# Patient Record
Sex: Female | Born: 1950
Health system: Southern US, Community
[De-identification: ages and names within clinical notes are randomized; demographics above are authoritative.]

## PROBLEM LIST (undated history)

## (undated) DIAGNOSIS — T7840XA Allergy, unspecified, initial encounter: Secondary | ICD-10-CM

## (undated) DIAGNOSIS — L719 Rosacea, unspecified: Secondary | ICD-10-CM

## (undated) DIAGNOSIS — J302 Other seasonal allergic rhinitis: Secondary | ICD-10-CM

## (undated) DIAGNOSIS — K5790 Diverticulosis of intestine, part unspecified, without perforation or abscess without bleeding: Secondary | ICD-10-CM

## (undated) DIAGNOSIS — M8580 Other specified disorders of bone density and structure, unspecified site: Secondary | ICD-10-CM

## (undated) DIAGNOSIS — M2550 Pain in unspecified joint: Secondary | ICD-10-CM

## (undated) DIAGNOSIS — R350 Frequency of micturition: Secondary | ICD-10-CM

## (undated) DIAGNOSIS — C50919 Malignant neoplasm of unspecified site of unspecified female breast: Secondary | ICD-10-CM

## (undated) DIAGNOSIS — R112 Nausea with vomiting, unspecified: Secondary | ICD-10-CM

## (undated) DIAGNOSIS — L309 Dermatitis, unspecified: Secondary | ICD-10-CM

## (undated) DIAGNOSIS — Z9889 Other specified postprocedural states: Secondary | ICD-10-CM

## (undated) DIAGNOSIS — Z8619 Personal history of other infectious and parasitic diseases: Secondary | ICD-10-CM

## (undated) DIAGNOSIS — D0510 Intraductal carcinoma in situ of unspecified breast: Secondary | ICD-10-CM

## (undated) DIAGNOSIS — K648 Other hemorrhoids: Secondary | ICD-10-CM

## (undated) DIAGNOSIS — C801 Malignant (primary) neoplasm, unspecified: Secondary | ICD-10-CM

## (undated) DIAGNOSIS — R351 Nocturia: Secondary | ICD-10-CM

## (undated) HISTORY — PX: BASAL CELL CARCINOMA EXCISION: SHX1214

## (undated) HISTORY — DX: Allergy, unspecified, initial encounter: T78.40XA

## (undated) HISTORY — DX: Other specified disorders of bone density and structure, unspecified site: M85.80

## (undated) HISTORY — PX: COLONOSCOPY: SHX174

## (undated) HISTORY — PX: TONSILLECTOMY: SUR1361

## (undated) HISTORY — DX: Malignant (primary) neoplasm, unspecified: C80.1

## (undated) HISTORY — PX: OTHER SURGICAL HISTORY: SHX169

---

## 2000-02-13 ENCOUNTER — Encounter: Payer: Self-pay | Admitting: Internal Medicine

## 2000-02-13 ENCOUNTER — Encounter: Admission: RE | Admit: 2000-02-13 | Discharge: 2000-02-13 | Payer: Self-pay | Admitting: Internal Medicine

## 2001-03-13 ENCOUNTER — Encounter: Payer: Self-pay | Admitting: Internal Medicine

## 2001-03-13 ENCOUNTER — Encounter: Admission: RE | Admit: 2001-03-13 | Discharge: 2001-03-13 | Payer: Self-pay | Admitting: Internal Medicine

## 2002-06-18 ENCOUNTER — Encounter: Payer: Self-pay | Admitting: Internal Medicine

## 2002-06-18 ENCOUNTER — Encounter: Admission: RE | Admit: 2002-06-18 | Discharge: 2002-06-18 | Payer: Self-pay | Admitting: Internal Medicine

## 2003-08-12 ENCOUNTER — Encounter: Payer: Self-pay | Admitting: Internal Medicine

## 2003-08-12 ENCOUNTER — Encounter: Admission: RE | Admit: 2003-08-12 | Discharge: 2003-08-12 | Payer: Self-pay | Admitting: Internal Medicine

## 2004-03-04 ENCOUNTER — Emergency Department (HOSPITAL_COMMUNITY): Admission: EM | Admit: 2004-03-04 | Discharge: 2004-03-04 | Payer: Self-pay | Admitting: *Deleted

## 2004-08-24 ENCOUNTER — Encounter: Admission: RE | Admit: 2004-08-24 | Discharge: 2004-08-24 | Payer: Self-pay | Admitting: Internal Medicine

## 2004-09-04 ENCOUNTER — Encounter: Admission: RE | Admit: 2004-09-04 | Discharge: 2004-09-04 | Payer: Self-pay | Admitting: Internal Medicine

## 2005-09-20 ENCOUNTER — Encounter: Admission: RE | Admit: 2005-09-20 | Discharge: 2005-09-20 | Payer: Self-pay | Admitting: Internal Medicine

## 2005-11-21 ENCOUNTER — Encounter: Admission: RE | Admit: 2005-11-21 | Discharge: 2005-11-21 | Payer: Self-pay | Admitting: *Deleted

## 2006-03-25 ENCOUNTER — Ambulatory Visit: Payer: Self-pay | Admitting: Internal Medicine

## 2006-05-06 ENCOUNTER — Ambulatory Visit: Payer: Self-pay | Admitting: Internal Medicine

## 2006-05-07 ENCOUNTER — Ambulatory Visit: Payer: Self-pay | Admitting: Internal Medicine

## 2006-10-10 ENCOUNTER — Encounter: Admission: RE | Admit: 2006-10-10 | Discharge: 2006-10-10 | Payer: Self-pay | Admitting: Internal Medicine

## 2007-01-01 ENCOUNTER — Encounter (INDEPENDENT_AMBULATORY_CARE_PROVIDER_SITE_OTHER): Payer: Self-pay | Admitting: Specialist

## 2007-01-01 ENCOUNTER — Ambulatory Visit (HOSPITAL_COMMUNITY): Admission: RE | Admit: 2007-01-01 | Discharge: 2007-01-01 | Payer: Self-pay | Admitting: Obstetrics & Gynecology

## 2007-09-22 ENCOUNTER — Encounter: Payer: Self-pay | Admitting: *Deleted

## 2007-09-22 DIAGNOSIS — F17200 Nicotine dependence, unspecified, uncomplicated: Secondary | ICD-10-CM

## 2007-09-22 DIAGNOSIS — Z9089 Acquired absence of other organs: Secondary | ICD-10-CM | POA: Insufficient documentation

## 2007-09-22 DIAGNOSIS — H6690 Otitis media, unspecified, unspecified ear: Secondary | ICD-10-CM | POA: Insufficient documentation

## 2007-10-23 ENCOUNTER — Encounter: Admission: RE | Admit: 2007-10-23 | Discharge: 2007-10-23 | Payer: Self-pay | Admitting: Internal Medicine

## 2007-10-28 ENCOUNTER — Encounter: Admission: RE | Admit: 2007-10-28 | Discharge: 2007-10-28 | Payer: Self-pay | Admitting: Internal Medicine

## 2008-04-15 ENCOUNTER — Encounter: Admission: RE | Admit: 2008-04-15 | Discharge: 2008-04-15 | Payer: Self-pay | Admitting: Internal Medicine

## 2008-10-24 ENCOUNTER — Encounter: Admission: RE | Admit: 2008-10-24 | Discharge: 2008-10-24 | Payer: Self-pay | Admitting: Internal Medicine

## 2009-10-25 ENCOUNTER — Encounter: Admission: RE | Admit: 2009-10-25 | Discharge: 2009-10-25 | Payer: Self-pay | Admitting: Internal Medicine

## 2010-10-26 ENCOUNTER — Encounter
Admission: RE | Admit: 2010-10-26 | Discharge: 2010-10-26 | Payer: Self-pay | Source: Home / Self Care | Attending: Internal Medicine | Admitting: Internal Medicine

## 2010-12-01 ENCOUNTER — Encounter: Payer: Self-pay | Admitting: Internal Medicine

## 2010-12-02 ENCOUNTER — Encounter: Payer: Self-pay | Admitting: Internal Medicine

## 2011-03-29 NOTE — Op Note (Signed)
NAMEFRANNIE, [name]             ACCOUNT NO.:  [id number]   MEDICAL RECORD NO.:  [id number]          PATIENT TYPE:  AMB   LOCATION:  SDC                           FACILITY:  [facility]   PHYSICIAN:  Genia Del, M.D.DATE OF BIRTH:  October 15, 1951   DATE OF PROCEDURE:  01/01/2007  DATE OF DISCHARGE:                               OPERATIVE REPORT   PREOPERATIVE DIAGNOSIS:  Intrauterine lesion, probable endometrial  polyp.   POSTOPERATIVE DIAGNOSIS:  Intrauterine lesion, probable endometrial  polyp.   INTERVENTION:  Hysteroscopy with resection and dilatation and curettage.   SURGEON:  Dr. Genia Del.   ASSISTANT:  None.   ANESTHESIOLOGIST:  Burnett Corrente, M.D.   PROCEDURE:  Under MAC analgesia, the patient is in lithotomy position.  She is prepped with Betadine on the suprapubic, vulvar and vaginal  areas.  The bladder is catheterized and the patient is draped as usual.  The vaginal exam reveals an anteverted uterus, slightly increased in  volume secondary to fibroids, about 9 cm, mobile, no adnexal mass.  The  speculum was introduced in the vagina.  The anterior lip of the cervix  is grasped with a tenaculum.  A paracervical block is done with  Nesacaine 1% a total of 20 mL at 4 and 8 o'clock.  We do a hysterometry  which is 9 cm.  We then dilate the cervix at Hegar dilators #27, without  difficulty.  We introduce the small resectoscope in the intrauterine  cavity.  We visualize the entire cavity.  An anterior lesion is present  toward the right side measuring about 2 cm in diameter compatible with  an endometrial polyp.  The rest of the cavity is normal except for  probable anterior intramural myoma slightly bulging on the anterior  wall.  We proceed with resection of the intrauterine lesion.  The  specimen is sent to pathology.  Hemostasis is completed with the cautery  current and pictures are taken before and after resection.  We then  proceed with a systematic  curettage of the entire uterine cavity on all  surfaces with a sharp curette.  The specimen is sent separately to  pathology.  We then remove the tenaculum on the anterior lip of the  cervix.  Hemostasis is adequate at all levels.  We remove the speculum.  The estimated blood loss was minimal.  The fluid deficit was 250 mL.  No  complications occurred and the patient was brought to recovery room in  good stable status.      Genia Del, M.D.  Electronically Signed     ML/MEDQ  D:  01/01/2007  T:  01/01/2007  Job:  [id number]

## 2011-10-01 ENCOUNTER — Other Ambulatory Visit: Payer: Self-pay | Admitting: Internal Medicine

## 2011-10-01 DIAGNOSIS — Z1231 Encounter for screening mammogram for malignant neoplasm of breast: Secondary | ICD-10-CM

## 2011-10-29 ENCOUNTER — Ambulatory Visit
Admission: RE | Admit: 2011-10-29 | Discharge: 2011-10-29 | Disposition: A | Discharge status: Home / Self Care | Payer: BC Managed Care – PPO | Source: Ambulatory Visit | Attending: Internal Medicine | Admitting: Internal Medicine

## 2011-10-29 DIAGNOSIS — Z1231 Encounter for screening mammogram for malignant neoplasm of breast: Secondary | ICD-10-CM

## 2012-03-16 ENCOUNTER — Encounter: Payer: Self-pay | Admitting: Internal Medicine

## 2012-03-17 ENCOUNTER — Encounter: Payer: Self-pay | Admitting: Internal Medicine

## 2012-03-20 ENCOUNTER — Other Ambulatory Visit (HOSPITAL_COMMUNITY): Payer: Self-pay | Admitting: Radiology

## 2012-03-20 DIAGNOSIS — J984 Other disorders of lung: Secondary | ICD-10-CM

## 2012-03-23 ENCOUNTER — Encounter: Payer: Self-pay | Admitting: Internal Medicine

## 2012-03-23 ENCOUNTER — Ambulatory Visit (AMBULATORY_SURGERY_CENTER): Payer: BC Managed Care – PPO | Admitting: *Deleted

## 2012-03-23 VITALS — Ht 66.0 in | Wt 135.6 lb

## 2012-03-23 DIAGNOSIS — Z1211 Encounter for screening for malignant neoplasm of colon: Secondary | ICD-10-CM

## 2012-03-23 MED ORDER — PEG-KCL-NACL-NASULF-NA ASC-C 100 G PO SOLR: ORAL | Status: DC

## 2012-03-26 ENCOUNTER — Ambulatory Visit (HOSPITAL_COMMUNITY)
Admission: RE | Admit: 2012-03-26 | Discharge: 2012-03-26 | Disposition: A | Discharge status: Home / Self Care | Payer: BC Managed Care – PPO | Source: Ambulatory Visit | Attending: Internal Medicine | Admitting: Internal Medicine

## 2012-03-26 DIAGNOSIS — R0989 Other specified symptoms and signs involving the circulatory and respiratory systems: Secondary | ICD-10-CM | POA: Insufficient documentation

## 2012-03-26 DIAGNOSIS — R0609 Other forms of dyspnea: Secondary | ICD-10-CM | POA: Insufficient documentation

## 2012-03-26 MED ORDER — ALBUTEROL SULFATE (5 MG/ML) 0.5% IN NEBU
2.5000 mg | INHALATION_SOLUTION | Freq: Once | RESPIRATORY_TRACT | Status: AC
  Administered 2012-03-26: 2.5 mg via RESPIRATORY_TRACT

## 2012-03-30 ENCOUNTER — Ambulatory Visit (AMBULATORY_SURGERY_CENTER): Payer: BC Managed Care – PPO | Admitting: Internal Medicine

## 2012-03-30 ENCOUNTER — Encounter: Payer: Self-pay | Admitting: Internal Medicine

## 2012-03-30 VITALS — BP 126/78 | HR 72 | Temp 97.9°F | Resp 13 | Ht 66.0 in | Wt 135.0 lb

## 2012-03-30 DIAGNOSIS — Z1211 Encounter for screening for malignant neoplasm of colon: Secondary | ICD-10-CM

## 2012-03-30 DIAGNOSIS — K5730 Diverticulosis of large intestine without perforation or abscess without bleeding: Secondary | ICD-10-CM

## 2012-03-30 MED ORDER — SODIUM CHLORIDE 0.9 % IV SOLN: 500.0000 mL | INTRAVENOUS | Status: DC

## 2012-03-30 NOTE — Op Note (Signed)
[facility] [address] [place], [place]  [address]  COLONOSCOPY PROCEDURE REPORT  PATIENT:  Catherine, [name]  MR#:  [id number] BIRTHDATE:  1950/12/14, 60 yrs. old  GENDER:  female ENDOSCOPIST:  Wilhemina Bonito. Eda Keys, MD REF. BY:  Rodrigo Ran, M.D. PROCEDURE DATE:  03/30/2012 PROCEDURE:  Average-risk screening colonoscopy G0121 ASA CLASS:  Class II INDICATIONS:  Routine Risk Screening, heme positive stool MEDICATIONS:   MAC sedation, administered by CRNA, propofol (Diprivan) 350 mg IV  DESCRIPTION OF PROCEDURE:   After the risks benefits and alternatives of the procedure were thoroughly explained, informed consent was obtained.  Digital rectal exam was performed and revealed no abnormalities.   The LB CF-[id number] endoscope was introduced through the anus and advanced to the cecum, which was identified by both the appendix and ileocecal valve, without limitations.  The quality of the prep was excellent, using MoviPrep.  The instrument was then slowly withdrawn as the colon was fully examined. <<PROCEDUREIMAGES>>  FINDINGS:  Moderate diverticulosis was found in the sigmoid colon. Otherwise normal colonoscopy without  polyps, masses, vascular ectasias, or inflammatory changes.   Retroflexed views in the rectum revealed internal hemorrhoids.    The time to cecum =  6:36 minutes. The scope was then withdrawn in 10:37  minutes from the cecum and the procedure completed.  COMPLICATIONS:  None  ENDOSCOPIC IMPRESSION: 1) Moderate diverticulosis in the sigmoid colon 2) Otherwise normal colonoscopy 3) Internal hemorrhoids  RECOMMENDATIONS: 1) Continue current colorectal screening recommendations for "routine risk" patients with a repeat colonoscopy in 10 years.  ______________________________ Wilhemina Bonito. Eda Keys, MD  CC:  Rodrigo Ran, MD; The Patient  n. eSIGNED:   Wilhemina Bonito. Eda Keys at 03/30/2012 12:52 PM  Daphene Jaeger, [phone]

## 2012-03-30 NOTE — Progress Notes (Signed)
Patient did not have preoperative order for IV antibiotic SSI prophylaxis. (G8918)  Patient did not experience any of the following events: a burn prior to discharge; a fall within the facility; wrong site/side/patient/procedure/implant event; or a hospital transfer or hospital admission upon discharge from the facility. (G8907)  

## 2012-03-30 NOTE — Patient Instructions (Signed)
YOU HAD AN ENDOSCOPIC PROCEDURE TODAY AT THE [facility]: Refer to the procedure report that was given to you for any specific questions about what was found during the examination.  If the procedure report does not answer your questions, please call your gastroenterologist to clarify.  If you requested that your care partner not be given the details of your procedure findings, then the procedure report has been included in a sealed envelope for you to review at your convenience later.  YOU SHOULD EXPECT: Some feelings of bloating in the abdomen. Passage of more gas than usual.  Walking can help get rid of the air that was put into your GI tract during the procedure and reduce the bloating. If you had a lower endoscopy (such as a colonoscopy or flexible sigmoidoscopy) you may notice spotting of blood in your stool or on the toilet paper. If you underwent a bowel prep for your procedure, then you may not have a normal bowel movement for a few days.  DIET: Your first meal following the procedure should be a light meal and then it is ok to progress to your normal diet.  A half-sandwich or bowl of soup is an example of a good first meal.  Heavy or fried foods are harder to digest and may make you feel nauseous or bloated.  Likewise meals heavy in dairy and vegetables can cause extra gas to form and this can also increase the bloating.  Drink plenty of fluids but you should avoid alcoholic beverages for 24 hours.  ACTIVITY: Your care partner should take you home directly after the procedure.  You should plan to take it easy, moving slowly for the rest of the day.  You can resume normal activity the day after the procedure however you should NOT DRIVE or use heavy machinery for 24 hours (because of the sedation medicines used during the test).    SYMPTOMS TO REPORT IMMEDIATELY: A gastroenterologist can be reached at any hour.  During normal business hours, 8:30 AM to 5:00 PM Monday through Friday,  call [phone].  After hours and on weekends, please call the GI answering service at [phone] who will take a message and have the physician on call contact you.   Following lower endoscopy (colonoscopy or flexible sigmoidoscopy):  Excessive amounts of blood in the stool  Significant tenderness or worsening of abdominal pains  Swelling of the abdomen that is new, acute  Fever of 100F or higher    FOLLOW UP: If any biopsies were taken you will be contacted by phone or by letter within the next 1-3 weeks.  Call your gastroenterologist if you have not heard about the biopsies in 3 weeks.  Our staff will call the home number listed on your records the next business day following your procedure to check on you and address any questions or concerns that you may have at that time regarding the information given to you following your procedure. This is a courtesy call and so if there is no answer at the home number and we have not heard from you through the emergency physician on call, we will assume that you have returned to your regular daily activities without incident.  SIGNATURES/CONFIDENTIALITY: You and/or your care partner have signed paperwork which will be entered into your electronic medical record.  These signatures attest to the fact that that the information above on your After Visit Summary has been reviewed and is understood.  Full responsibility of the confidentiality   of this discharge information lies with you and/or your care-partner.     

## 2012-03-31 ENCOUNTER — Telehealth: Payer: Self-pay

## 2012-03-31 NOTE — Telephone Encounter (Signed)
  Follow up Call-  Call back number 03/30/2012  Post procedure Call Back phone  # [phone]  Permission to leave phone message Yes     Patient questions:  Do you have a fever, pain , or abdominal swelling? no Pain Score  0 *  Have you tolerated food without any problems? yes  Have you been able to return to your normal activities? yes  Do you have any questions about your discharge instructions: Diet   no Medications  no Follow up visit  no  Do you have questions or concerns about your Care? no  Actions: * If pain score is 4 or above: No action needed, pain <4.

## 2012-04-28 ENCOUNTER — Other Ambulatory Visit: Payer: Self-pay | Admitting: Internal Medicine

## 2012-04-29 ENCOUNTER — Other Ambulatory Visit: Payer: Self-pay | Admitting: Obstetrics & Gynecology

## 2012-04-29 DIAGNOSIS — N951 Menopausal and female climacteric states: Secondary | ICD-10-CM

## 2012-05-11 ENCOUNTER — Ambulatory Visit
Admission: RE | Admit: 2012-05-11 | Discharge: 2012-05-11 | Disposition: A | Discharge status: Home / Self Care | Payer: BC Managed Care – PPO | Source: Ambulatory Visit | Attending: Obstetrics & Gynecology | Admitting: Obstetrics & Gynecology

## 2012-05-11 DIAGNOSIS — N951 Menopausal and female climacteric states: Secondary | ICD-10-CM

## 2012-05-25 ENCOUNTER — Encounter: Payer: BC Managed Care – PPO | Admitting: Internal Medicine

## 2012-09-23 ENCOUNTER — Other Ambulatory Visit: Payer: Self-pay | Admitting: Obstetrics & Gynecology

## 2012-09-23 DIAGNOSIS — Z9289 Personal history of other medical treatment: Secondary | ICD-10-CM

## 2012-10-30 ENCOUNTER — Ambulatory Visit
Admission: RE | Admit: 2012-10-30 | Discharge: 2012-10-30 | Disposition: A | Discharge status: Home / Self Care | Payer: BC Managed Care – PPO | Source: Ambulatory Visit | Attending: Obstetrics & Gynecology | Admitting: Obstetrics & Gynecology

## 2012-10-30 DIAGNOSIS — Z9289 Personal history of other medical treatment: Secondary | ICD-10-CM

## 2013-10-20 ENCOUNTER — Other Ambulatory Visit: Payer: Self-pay

## 2013-10-20 DIAGNOSIS — Z1231 Encounter for screening mammogram for malignant neoplasm of breast: Secondary | ICD-10-CM

## 2013-11-11 DIAGNOSIS — D0510 Intraductal carcinoma in situ of unspecified breast: Secondary | ICD-10-CM

## 2013-11-11 HISTORY — DX: Intraductal carcinoma in situ of unspecified breast: D05.10

## 2013-11-11 HISTORY — PX: BREAST LUMPECTOMY: SHX2

## 2013-11-23 ENCOUNTER — Ambulatory Visit
Admission: RE | Admit: 2013-11-23 | Discharge: 2013-11-23 | Disposition: A | Discharge status: Home / Self Care | Payer: BC Managed Care – PPO | Source: Ambulatory Visit

## 2013-11-23 DIAGNOSIS — Z1231 Encounter for screening mammogram for malignant neoplasm of breast: Secondary | ICD-10-CM

## 2013-11-25 ENCOUNTER — Other Ambulatory Visit: Payer: Self-pay | Admitting: Obstetrics & Gynecology

## 2013-11-25 DIAGNOSIS — R928 Other abnormal and inconclusive findings on diagnostic imaging of breast: Secondary | ICD-10-CM

## 2013-12-06 ENCOUNTER — Other Ambulatory Visit: Payer: Self-pay | Admitting: Obstetrics & Gynecology

## 2013-12-06 ENCOUNTER — Ambulatory Visit
Admission: RE | Admit: 2013-12-06 | Discharge: 2013-12-06 | Disposition: A | Discharge status: Home / Self Care | Payer: BC Managed Care – PPO | Source: Ambulatory Visit | Attending: Obstetrics & Gynecology | Admitting: Obstetrics & Gynecology

## 2013-12-06 DIAGNOSIS — R928 Other abnormal and inconclusive findings on diagnostic imaging of breast: Secondary | ICD-10-CM

## 2013-12-06 DIAGNOSIS — R921 Mammographic calcification found on diagnostic imaging of breast: Secondary | ICD-10-CM

## 2013-12-09 ENCOUNTER — Ambulatory Visit
Admission: RE | Admit: 2013-12-09 | Discharge: 2013-12-09 | Disposition: A | Discharge status: Home / Self Care | Payer: BC Managed Care – PPO | Source: Ambulatory Visit | Attending: Obstetrics & Gynecology | Admitting: Obstetrics & Gynecology

## 2013-12-09 DIAGNOSIS — R921 Mammographic calcification found on diagnostic imaging of breast: Secondary | ICD-10-CM

## 2013-12-09 DIAGNOSIS — C50919 Malignant neoplasm of unspecified site of unspecified female breast: Secondary | ICD-10-CM

## 2013-12-09 HISTORY — DX: Malignant neoplasm of unspecified site of unspecified female breast: C50.919

## 2013-12-09 HISTORY — PX: BREAST BIOPSY: SHX20

## 2013-12-10 ENCOUNTER — Other Ambulatory Visit: Payer: Self-pay | Admitting: Obstetrics & Gynecology

## 2013-12-10 DIAGNOSIS — C50919 Malignant neoplasm of unspecified site of unspecified female breast: Secondary | ICD-10-CM

## 2013-12-13 ENCOUNTER — Telehealth: Payer: Self-pay | Admitting: *Deleted

## 2013-12-13 ENCOUNTER — Ambulatory Visit
Admission: RE | Admit: 2013-12-13 | Discharge: 2013-12-13 | Disposition: A | Discharge status: Home / Self Care | Payer: BC Managed Care – PPO | Source: Ambulatory Visit | Attending: Obstetrics & Gynecology | Admitting: Obstetrics & Gynecology

## 2013-12-13 DIAGNOSIS — C50919 Malignant neoplasm of unspecified site of unspecified female breast: Secondary | ICD-10-CM

## 2013-12-13 DIAGNOSIS — C50211 Malignant neoplasm of upper-inner quadrant of right female breast: Secondary | ICD-10-CM | POA: Insufficient documentation

## 2013-12-13 MED ORDER — GADOBENATE DIMEGLUMINE 529 MG/ML IV SOLN
12.0000 mL | Freq: Once | INTRAVENOUS | Status: AC | PRN
  Administered 2013-12-13: 12 mL via INTRAVENOUS

## 2013-12-13 NOTE — Telephone Encounter (Signed)
Received call back from patient and confirmed [facility] appt for 12/15/13 at 8am.  Instructions and contact information given.

## 2013-12-13 NOTE — Telephone Encounter (Signed)
Left message for a return phone call to schedule appt. For [facility].

## 2013-12-15 ENCOUNTER — Ambulatory Visit
Admission: RE | Admit: 2013-12-15 | Discharge: 2013-12-15 | Disposition: A | Discharge status: Home / Self Care | Payer: BC Managed Care – PPO | Source: Ambulatory Visit | Attending: Radiation Oncology | Admitting: Radiation Oncology

## 2013-12-15 ENCOUNTER — Encounter: Payer: Self-pay | Admitting: *Deleted

## 2013-12-15 ENCOUNTER — Encounter (INDEPENDENT_AMBULATORY_CARE_PROVIDER_SITE_OTHER): Payer: Self-pay

## 2013-12-15 ENCOUNTER — Telehealth: Payer: Self-pay | Admitting: *Deleted

## 2013-12-15 ENCOUNTER — Encounter: Payer: Self-pay | Admitting: Oncology

## 2013-12-15 ENCOUNTER — Ambulatory Visit (HOSPITAL_BASED_OUTPATIENT_CLINIC_OR_DEPARTMENT_OTHER): Payer: BC Managed Care – PPO | Admitting: Oncology

## 2013-12-15 ENCOUNTER — Ambulatory Visit: Payer: BC Managed Care – PPO

## 2013-12-15 ENCOUNTER — Other Ambulatory Visit (HOSPITAL_BASED_OUTPATIENT_CLINIC_OR_DEPARTMENT_OTHER): Payer: BC Managed Care – PPO

## 2013-12-15 ENCOUNTER — Ambulatory Visit: Payer: BC Managed Care – PPO | Admitting: Physical Therapy

## 2013-12-15 ENCOUNTER — Ambulatory Visit (HOSPITAL_BASED_OUTPATIENT_CLINIC_OR_DEPARTMENT_OTHER): Payer: BC Managed Care – PPO | Admitting: General Surgery

## 2013-12-15 VITALS — BP 129/87 | HR 82 | Temp 97.8°F | Resp 18 | Ht 66.0 in | Wt 134.7 lb

## 2013-12-15 DIAGNOSIS — Z170 Estrogen receptor positive status [ER+]: Secondary | ICD-10-CM

## 2013-12-15 DIAGNOSIS — C50211 Malignant neoplasm of upper-inner quadrant of right female breast: Secondary | ICD-10-CM

## 2013-12-15 DIAGNOSIS — D0590 Unspecified type of carcinoma in situ of unspecified breast: Secondary | ICD-10-CM

## 2013-12-15 DIAGNOSIS — M949 Disorder of cartilage, unspecified: Secondary | ICD-10-CM

## 2013-12-15 DIAGNOSIS — C50219 Malignant neoplasm of upper-inner quadrant of unspecified female breast: Secondary | ICD-10-CM

## 2013-12-15 DIAGNOSIS — M899 Disorder of bone, unspecified: Secondary | ICD-10-CM

## 2013-12-15 DIAGNOSIS — F17200 Nicotine dependence, unspecified, uncomplicated: Secondary | ICD-10-CM

## 2013-12-15 LAB — CBC WITH DIFFERENTIAL/PLATELET
BASO%: 0.3 % (ref 0.0–2.0)
Basophils Absolute: 0 10*3/uL (ref 0.0–0.1)
EOS%: 1.8 % (ref 0.0–7.0)
Eosinophils Absolute: 0.1 10*3/uL (ref 0.0–0.5)
HCT: 43.1 % (ref 34.8–46.6)
HGB: 14.9 g/dL (ref 11.6–15.9)
LYMPH%: 34.2 % (ref 14.0–49.7)
MCH: 31.6 pg (ref 25.1–34.0)
MCHC: 34.6 g/dL (ref 31.5–36.0)
MCV: 91.3 fL (ref 79.5–101.0)
MONO#: 0.6 10*3/uL (ref 0.1–0.9)
MONO%: 8.7 % (ref 0.0–14.0)
NEUT#: 3.7 10*3/uL (ref 1.5–6.5)
NEUT%: 55 % (ref 38.4–76.8)
Platelets: 339 10*3/uL (ref 145–400)
RBC: 4.72 10*6/uL (ref 3.70–5.45)
RDW: 13 % (ref 11.2–14.5)
WBC: 6.8 10*3/uL (ref 3.9–10.3)
lymph#: 2.3 10*3/uL (ref 0.9–3.3)

## 2013-12-15 LAB — COMPREHENSIVE METABOLIC PANEL (CC13)
ALT: 17 U/L (ref 0–55)
AST: 17 U/L (ref 5–34)
Albumin: 4.1 g/dL (ref 3.5–5.0)
Alkaline Phosphatase: 65 U/L (ref 40–150)
Anion Gap: 8 mEq/L (ref 3–11)
BUN: 14.5 mg/dL (ref 7.0–26.0)
CO2: 27 mEq/L (ref 22–29)
Calcium: 10 mg/dL (ref 8.4–10.4)
Chloride: 107 mEq/L (ref 98–109)
Creatinine: 0.7 mg/dL (ref 0.6–1.1)
Glucose: 80 mg/dl (ref 70–140)
Potassium: 3.9 mEq/L (ref 3.5–5.1)
Sodium: 142 mEq/L (ref 136–145)
Total Bilirubin: 0.53 mg/dL (ref 0.20–1.20)
Total Protein: 6.8 g/dL (ref 6.4–8.3)

## 2013-12-15 NOTE — Progress Notes (Signed)
[facility] Psychosocial Distress Screening  Clinical Social Work   Patient completed distress screening protocol, and scored a 1 on the Psychosocial Distress Thermometer which indicates mild distress. Clinical Education officer, museum met with pt and pt's husband in [facility] to assess for distress and other psychosocial needs. Pt stated she was doing well and felt confident in her prognosis and treatment plan.  CSW shared information on the support team and support services at [facility], and encouraged pt to call with any additional questions or concerns.    Johnnye Lana, MSW, [place] Worker  [facility]  [phone]

## 2013-12-15 NOTE — Progress Notes (Signed)
Give patient [facility] packet. Checked in with no financial issues and she has appt card.

## 2013-12-15 NOTE — Progress Notes (Signed)
Chief Complaint: New diagnosis of breast cancer  History:    Catherine [name] is a 63 y.o. postmenopausal female referred by Dr. Elizabeth Eagle  for evaluation of recently diagnosed carcinoma of the right breast. She recently presented for a screening mamogram revealing a small area of clustered calcifications..  Subsequent imaging included diagnostic mamogram showing a 10 x 14 mm group of amorphous calcifications in the medial aspect of the right breast at the 2 to 3:00 position.  A stereotactic biopsy was performed on 12/09/2013 with pathology revealing ductal carcinoma in-situ of the breast. She is seen now in multidisciplinary clinic for initial treatment planning.  She has experienced no breast symptoms, specifically no pain or lump or skin change or nipple discharge.  She does have a personal history of fibrocystic breast changes in years past but no surgical treatment. Subsequent MRI has been performed showing a 1.2 cm area of enhancement and postbiopsy change at the known area of malignancy but no other abnormalities.  Findings at that time were the following:  Tumor size: 1.4 cm  Tumor grade: 1  Estrogen Receptor: positive Progesterone Receptor: positive    Past Medical History  Diagnosis Date  . Allergy     Past Surgical History  Procedure Laterality Date  . Uterine polyp removed    . Tonsillectomy      Current Outpatient Prescriptions  Medication Sig Dispense Refill  . ALFALFA PO Take by mouth daily.      . Cholecalciferol (VITAMIN D-3 PO) Take by mouth daily.      . fexofenadine (ALLEGRA) 180 MG tablet Take 180 mg by mouth daily.      . fluticasone (FLONASE) 50 MCG/ACT nasal spray Daily.      . Multiple Vitamins-Minerals (MULTIVITAMIN PO) Take 1 tablet by mouth daily.      . NAT-RUL PSYLLIUM SEED HUSKS PO Take by mouth daily.      . Omega-3 Fatty Acids (FISH OIL PO) Take 360 mg by mouth. Takes 1-3 daily       No current facility-administered medications for this visit.     Family History  Problem Relation Age of Onset  . Colon cancer Neg Hx   . Esophageal cancer Neg Hx   . Stomach cancer Neg Hx   . Rectal cancer Neg Hx   . Bladder Cancer Brother   . Breast cancer Paternal Aunt   . Bladder Cancer Brother     History   Social History  . Marital Status: Married    Spouse Name: N/A    Number of Children: N/A  . Years of Education: N/A   Social History Main Topics  . Smoking status: Current Every Day Smoker -- 1.00 packs/day    Types: Cigarettes  . Smokeless tobacco: Never Used  . Alcohol Use: 0.0 oz/week    7-10 Cans of beer per week  . Drug Use: No  . Sexual Activity: Not on file   Other Topics Concern  . Not on file   Social History Narrative  . No narrative on file     Review of Systems Constitutional: negative Respiratory: negative Cardiovascular: negative Gastrointestinal: negative, colonoscopy is up-to-date Allergic/Immunologic: positive for rosacea     Objective:  There were no vitals taken for this visit.  General: Alert, well-developed Caucasian female, in no distress Skin: Warm and dry without rash or infection. HEENT: No palpable masses or thyromegaly. Sclera nonicteric. Pupils equal round and reactive. Oropharynx clear. Breasts: small breasts bilaterally. There is a slight area   of thickening and bruising and tenderness in the medial right breast postbiopsy change. No skin changes or definite mass or nipple discharge. Lymph nodes: No cervical, supraclavicular, or inguinal nodes palpable. Lungs: Breath sounds clear and equal without increased work of breathing Cardiovascular: Regular rate and rhythm without murmur. No JVD or edema. Peripheral pulses intact. Abdomen: Nondistended. Soft and nontender. No masses palpable. No organomegaly. No palpable hernias. Extremities: No edema or joint swelling or deformity. No chronic venous stasis changes. Neurologic: Alert and fully oriented. Gait normal.   Laboratory data:   CBC:  Lab Results  Component Value Date   WBC 6.8 12/15/2013   RBC 4.72 12/15/2013   HGB 14.9 12/15/2013   HCT 43.1 12/15/2013   PLT 339 12/15/2013  ]  [facility] Labs:  Lab Results  Component Value Date   NA 142 12/15/2013   K 3.9 12/15/2013   CO2 27 12/15/2013   BUN 14.5 12/15/2013   CREATININE 0.7 12/15/2013   CALCIUM 10.0 12/15/2013   PROT 6.8 12/15/2013   BILITOT 0.53 12/15/2013   ALKPHOS 65 12/15/2013   AST 17 12/15/2013   ALT 17 12/15/2013     Assessment  63 y.o. female with a new diagnosis of cancer of the the right breast upper inner quadrant.  Clinical 0, estrogen receptor positive. I discussed with the patient and family members present today initial surgical treatment options. We discussed options of breast conservation with lumpectomy or total mastectomy. We discussed that she would be a very good candidate for breast conservation..  After discussion they have elected to proceed with right partial mastectomy.  We discussed the indications and nature of the procedure, and expected recovery, in detail. Surgical risks including anesthetic complications, cardiorespiratory complications, bleeding, infection, wound healing complications, blood clots,  local and distant recurrence and possible need for further surgery based on the final pathology was discussed and understood. Hormonal therapy and radiation therapy have been discussed. They have been provided with literature regarding the treatment of breast cancer. She strongly wants to avoid the general anesthesia.  All questions were answered. They understand and agree to proceed and we will go ahead with scheduling.  Plan right partial mastectomy with needle localization as an outpatient under local anesthesia with sedation.  Edward Jolly MD, FACS  12/15/2013, 9:50 AM

## 2013-12-15 NOTE — Progress Notes (Signed)
[name]  Telephone:(336) [phone] Fax:(336) [phone]     ID: [name] OB: [birth year]  MR#: [id number]  [id number]  PCP: [name], MD GYN:  [name] SU: Excell Seltzer OTHER MD: [name]  CHIEF COMPLAINT: "I have a low-grade breast cancer".  HISTORY OF PRESENT ILLNESS: Catherine [name] underwent routine bilateral screening mammography at the breast center 11/23/2013. Calcifications were noted in the right breast, and a right unilateral diagnostic mammogram 12/06/2013 confirmed a 10 mm group of amorphous calcifications in the upper inner quadrant of the right breast. Biopsy of this area on 12/09/2013 showed ([id number]) ductal carcinoma in situ, low-grade, strongly estrogen and progesterone receptor positive.  On 12/13/2013 the patient underwent bilateral breast MRI. This showed, in the upper-inner quadrant of the right breast, an area of enhancement along the biopsy tract measuring 1.2 cm, with no other areas of suspicious enhancement in either breast and no evidence of abnormal adenopathy.  The patient's subsequent history is as detailed below  INTERVAL HISTORY: Catherine [name] was evaluated in the multidisciplinary breast clinic 12/15/2013, accompanied by her husband Catherine [name].  REVIEW OF SYSTEMS: There were no specific symptoms leading to the original mammogram, which was routinely scheduled. The patient denies unusual headaches, visual changes, nausea, vomiting, stiff neck, dizziness, or gait imbalance. There has been no cough, phlegm production, or pleurisy, no chest pain or pressure, and no change in bowel or bladder habits other than occasional drops of blood on the wiping tissue after a hard bowel movement.. The patient denies fever, rash, bleeding, unexplained fatigue or unexplained weight loss. A detailed review of systems was otherwise entirely negative.  PAST MEDICAL HISTORY: Past Medical History  Diagnosis Date  . Allergy     PAST  SURGICAL HISTORY: Past Surgical History  Procedure Laterality Date  . Uterine polyp removed    . Tonsillectomy    . Basal cell carcinoma excision      From the right cheek    FAMILY HISTORY Family History  Problem Relation Age of Onset  . Colon cancer Neg Hx   . Esophageal cancer Neg Hx   . Stomach cancer Neg Hx   . Rectal cancer Neg Hx   . Bladder Cancer Brother   . Breast cancer Paternal Aunt   . Bladder Cancer Brother    the patient's father died at the age of 27 following intracranial surgery to relieve what sounds like a subdural hematoma. The patient's mother died with Alzheimer's disease at the age of 53. The patient has 4 brothers, one sister. The only breast cancer in the family is a paternal aunt diagnosed at age 41. The patient does have 2 brothers diagnosed with bladder cancer in their 61s.  GYNECOLOGIC HISTORY:  Menarche age 45. The patient is GX P0. She stopped having periods approximately 2009. She did not use hormone replacement.  SOCIAL HISTORY:  Atley works as an Optometrist. Her husband Catherine [name] used to work for [employer], but is now retired. They live alone, with no pets. They attend [employer] [place] .    ADVANCED DIRECTIVES: Not in place   HEALTH MAINTENANCE: History  Substance Use Topics  . Smoking status: Current Every Day Smoker -- 1.00 packs/day    Types: Cigarettes  . Smokeless tobacco: Never Used  . Alcohol Use: 0.0 oz/week    7-10 Cans of beer per week     Colonoscopy: 2013  PAP:  Bone density: 05/11/2012 with a T score of -2.0  Lipid panel:  No Known Allergies  Current Outpatient Prescriptions  Medication Sig Dispense Refill  . ALFALFA PO Take by mouth daily.      . Cholecalciferol (VITAMIN D-3 PO) Take by mouth daily.      . fexofenadine (ALLEGRA) 180 MG tablet Take 180 mg by mouth daily.      . Multiple Vitamins-Minerals (MULTIVITAMIN PO) Take 1 tablet by mouth daily.      Marland Kitchen NAT-RUL PSYLLIUM SEED HUSKS PO Take by mouth  daily.      . Omega-3 Fatty Acids (FISH OIL PO) Take 360 mg by mouth. Takes 1-3 daily      . fluticasone (FLONASE) 50 MCG/ACT nasal spray Daily.       No current facility-administered medications for this visit.    OBJECTIVE: middle-aged white woman in no acute distress Filed Vitals:   12/15/13 0800  BP: 129/87  Pulse: 82  Temp: 97.8 F (36.6 C)  Resp: 18     Body mass index is 21.75 kg/(m^2).    ECOG FS:0 - Asymptomatic  Ocular: Sclerae unicteric, pupils equal, round and reactive to light Ear-nose-throat: Oropharynx clear, dentition in good repair Lymphatic: No cervical or supraclavicular adenopathy Lungs no rales or rhonchi, good excursion bilaterally Heart regular rate and rhythm, no murmur appreciated Abd soft, nontender, positive bowel sounds MSK no focal spinal tenderness, no joint edema Neuro: non-focal, well-oriented, pleasant affect Breasts: The right breast is status post recent biopsy. There is a minimal ecchymosis. I do not palpate a mass. There is no skin or nipple change of concern. The right axilla is benign. The left breast is unremarkable   LAB RESULTS:  CMP     Component Value Date/Time   NA 142 12/15/2013 0835   K 3.9 12/15/2013 0835   CO2 27 12/15/2013 0835   GLUCOSE 80 12/15/2013 0835   BUN 14.5 12/15/2013 0835   CREATININE 0.7 12/15/2013 0835   CALCIUM 10.0 12/15/2013 0835   PROT 6.8 12/15/2013 0835   ALBUMIN 4.1 12/15/2013 0835   AST 17 12/15/2013 0835   ALT 17 12/15/2013 0835   ALKPHOS 65 12/15/2013 0835   BILITOT 0.53 12/15/2013 0835    I No results found for this basename: SPEP,  UPEP,   kappa and lambda light chains    Lab Results  Component Value Date   WBC 6.8 12/15/2013   NEUTROABS 3.7 12/15/2013   HGB 14.9 12/15/2013   HCT 43.1 12/15/2013   MCV 91.3 12/15/2013   PLT 339 12/15/2013      Chemistry      Component Value Date/Time   NA 142 12/15/2013 0835   K 3.9 12/15/2013 0835   CO2 27 12/15/2013 0835   BUN 14.5 12/15/2013 0835   CREATININE 0.7 12/15/2013 0835        Component Value Date/Time   CALCIUM 10.0 12/15/2013 0835   ALKPHOS 65 12/15/2013 0835   AST 17 12/15/2013 0835   ALT 17 12/15/2013 0835   BILITOT 0.53 12/15/2013 0835       No results found for this basename: LABCA2    No components found with this basename: LABCA125    No results found for this basename: INR,  in the last 168 hours  Urinalysis No results found for this basename: colorurine,  appearanceur,  labspec,  phurine,  glucoseu,  hgbur,  bilirubinur,  ketonesur,  proteinur,  urobilinogen,  nitrite,  leukocytesur    STUDIES: Mr Breast Bilateral W Wo Contrast  12/13/2013   CLINICAL DATA:  Stereotactic guided core biopsy of right breast  calcification shows low-grade ductal carcinoma in situ.  EXAM: BILATERAL BREAST MRI WITH AND WITHOUT CONTRAST  LABS:  BUN and creatinine were obtained on site at [facility] at  [address]  Results:  BUN 10.0 mg/dL,  Creatinine 0.7 mg/dL.  TECHNIQUE: Multiplanar, multisequence MR images of both breasts were obtained prior to and following the intravenous administration of 41ml of MultiHance.  THREE-DIMENSIONAL MR IMAGE RENDERING ON INDEPENDENT WORKSTATION:  Three-dimensional MR images were rendered by post-processing of the original MR data on an independent workstation. The three-dimensional MR images were interpreted, and findings are reported in the following complete MRI report for this study. Three dimensional images were evaluated at the independent DynaCad workstation  COMPARISON:  Mammogram from [facility] 12/09/2013 and earlier  FINDINGS: Breast composition: c:  Heterogeneous fibroglandular tissue  Background parenchymal enhancement: Moderate  Right breast: Within the upper inner quadrant of the right breast, there is minimal enhancement along the biopsy tract, measuring 1.2 x 0.9 cm. This region of enhancement is associated with biopsy clip following recent stereotactic guided core biopsy. No other suspicious  enhancement is identified within the right breast to suggest malignancy.  Left breast: No mass or abnormal enhancement.  Lymph nodes: No abnormal appearing lymph nodes.  Ancillary findings:  None.  IMPRESSION: 1. Minimal enhancement consistent with recent biopsy site in the upper inner quadrant of the right breast. 2. No suspicious enhancement in the left breast.  RECOMMENDATION: Treatment plan for known malignancy.  BI-RADS CATEGORY  6: Known biopsy-proven malignancy - appropriate action should be taken.   Electronically Signed   By: Shon Hale M.D.   On: 12/13/2013 11:33   Mm Digital Diagnostic Unilat R  12/06/2013   CLINICAL DATA:  Patient recalled from screening for right breast calcifications.  EXAM: DIGITAL DIAGNOSTIC  RIGHT MAMMOGRAM WITH CAD  COMPARISON:  [facility]  ACR Breast Density Category c: The breast tissue is heterogeneously dense, which may obscure small masses.  FINDINGS: Within the medial aspect of the right breast 2-3 o'clock position there is a 10 x 5 x 14 mm group of a amorphous calcifications.  Mammographic images were processed with CAD.  IMPRESSION: Suspicious right breast calcifications. A stereotactic guided biopsy is recommended.  RECOMMENDATION: Stereotactic guided biopsy right breast calcifications. This is scheduled for 12/09/2013 at 1:45 p.m.  I have discussed the findings and recommendations with the patient. Results were also provided in writing at the conclusion of the visit. If applicable, a reminder letter will be sent to the patient regarding the next appointment.  BI-RADS CATEGORY  4: Suspicious abnormality - biopsy should be considered.   Electronically Signed   By: Lovey Newcomer M.D.   On: 12/06/2013 15:21   Mm Screening Breast Tomo Bilateral  11/24/2013   CLINICAL DATA:  Screening.  EXAM: DIGITAL SCREENING BILATERAL MAMMOGRAM WITH 3D TOMO WITH CAD  COMPARISON:  Previous Exam(s)  ACR Breast Density Category c: The breasts are heterogeneously dense, which may obscure small  masses.  FINDINGS: In the right breast, calcifications warrant further evaluation with magnified views. In the left breast, no mass or malignant type calcifications are identified. Images were processed with CAD.  IMPRESSION: Further evaluation is suggested for calcifications in the right breast.  RECOMMENDATION: Diagnostic mammogram of the right breast. (Code:[id number])  The patient will be contacted regarding the findings, and additional imaging will be scheduled.  BI-RADS CATEGORY  0: Incomplete. Need additional imaging evaluation and/or prior mammograms for comparison.   Electronically  Signed   By: Lillia Mountain M.D.   On: 11/24/2013 10:16   Mm Rt Breast Bx W Loc Dev 1st Lesion Image Bx Spec Stereo Guide  12/10/2013   CLINICAL DATA:  63 year old female with for tissue sampling of heterogeneous calcifications within the inner right breast.  EXAM: RIGHT STEREOTACTIC CORE NEEDLE BIOPSY  COMPARISON:  Previous exams.  FINDINGS: The patient and I discussed the procedure of stereotactic-guided biopsy including benefits and alternatives. We discussed the high likelihood of a successful procedure. We discussed the risks of the procedure including infection, bleeding, tissue injury, clip migration, and inadequate sampling. Informed written consent was given. The usual time out protocol was performed immediately prior to the procedure.  Using sterile technique and 2% Lidocaine as local anesthetic, under stereotactic guidance, a 12 gauge vacuum assisted needle device was used to perform core needle biopsy of calcifications within the inner right breast using a superior approach. Specimen radiograph was performed showing calcifications. Specimens with calcifications are identified for pathology.  At the conclusion of the procedure, a coil shaped tissue marker clip was deployed into the biopsy cavity. Follow-up 2-view mammogram confirmed clip placement to be satisfactory.  IMPRESSION: Stereotactic-guided biopsy of right  breast calcifications. No apparent complications.  Final pathology demonstrates LOW-GRADE DCIS.  Histology correlates with imaging findings.  The patient was contacted by phone on 12/10/2013 and these results given to her which she understood. Her questions were answered.  The patient had no complaints with her biopsy site. .  Recommend surgery/oncology consultation. An appointment at the multi disciplinary [facility] has been scheduled for 12/15/2013 and the patient informed.  Recommend bilateral breast MRI which has been scheduled for 12/13/2013 and the patient informed.   Electronically Signed   By: Hassan Rowan M.D.   On: 12/10/2013 11:11    ASSESSMENT: 63 y.o. [employer] woman status post right breast biopsy 12/09/2013 for ductal carcinoma in situ him a low-grade, estrogen and progesterone receptor strongly positive.  (1) osteopenia, T score of -2.0 July of 2014  (2) tobacco abuse, active  PLAN: We spent the better part of today's hour-long appointment discussing the biology of breast cancer in general, and the specifics of the patient's tumor in particular. Moderate understands that in noninvasive breast cancer in and of itself is not a life-threatening. The cells are "trapped" in the ducts, and cannot travel to lymph nodes or vital organs. Therefore she is not putting her self added risk by forgoing mastectomy and a standard of care here (as as with invasive disease) is lumpectomy, with no sentinel lymph node sampling.  Because she will keep her breast, there will be some risk of local recurrence. Half those recurrences may be invasive. She will significantly lower that risk by undergoing radiation. She can lowered yet further by taking antiestrogen 6 after the radiation is completed.  The overall sequence will consist of surgery followed by adjuvant radiation and then a discussion regarding antiestrogen to. She has many questions regarding the exact benefit (in terms of percentage degrees  of the risk of recurrence) to be gained from radiation, and these can be addressed by Dr. Valere Dross when he needs with the patient with the final pathology in hand. I am making Weltha a return appointment with me in approximately 2 months. By then she should be done with her local treatment and can consider antiestrogen.  I have strongly recommended that Joycelyn Schmid not smoke for the next 2 months, so that she can heal properly from her upcoming  surgery and get the maximum benefit from her upcoming radiation treatments.  Hemen has a good understanding of the overall plan, and agrees with it. She knows a goal of treatment in her case is cure. She will call with any problems that may develop before next visit here.     Chauncey Cruel, MD   12/15/2013 11:27 AM

## 2013-12-15 NOTE — Progress Notes (Addendum)
[facility] NEW PATIENT EVALUATION  Name: Catherine [name] MRN: [id number]  Date:   12/15/2013           DOB: [birth year]  Status: outpatient   CC: [name], MD  Hoxworth, Darene Lamer, MD    REFERRING PHYSICIAN: Excell Seltzer Darene Lamer, MD   DIAGNOSIS: Stage 0 (Tis N0 M0) low-grade DCIS of the right breast   HISTORY OF PRESENT ILLNESS:  Catherine [name] is a 63 y.o. female who is seen today at the [facility] through the courtesy of Dr. Excell Seltzer for evaluation of her low-grade DCIS of the right breast. At the time of a screening mammogram at the [facility] on 11/23/2013 she was felt to have suspicious calcifications within the right breast. Additional views showed a cluster of calcifications measure 1.0 x 0.5 x 1.4 cm at the 2 to 3:00 position. Stereotactic biopsy on 12/09/2013 was diagnostic for low-grade DCIS. Breast MR on 12/13/2013 showed minimal enhancement with recent biopsy changes within the upper inner quadrant of the right breast. She is without complaints today. She was told that she had a right breast fibroid in the past. She seen today with Dr. Excell Seltzer and Dr. Jana Hakim.  PREVIOUS RADIATION THERAPY: No   PAST MEDICAL HISTORY:  has a past medical history of Allergy.     PAST SURGICAL HISTORY:  Past Surgical History  Procedure Laterality Date  . Uterine polyp removed    . Tonsillectomy    . Basal cell carcinoma excision      From the right cheek     FAMILY HISTORY: family history includes Bladder Cancer in her brother and brother; Breast cancer in her paternal aunt. There is no history of Colon cancer, Esophageal cancer, Stomach cancer, or Rectal cancer.  Her father died of a heart attack at 37. Her mother died from complications of Alzheimer's disease at 44. A paternal aunt was diagnosed with breast cancer 76 and lived to be almost 60. 2 brothers were diagnosed with what sounds like superficial bladder cancer at age 39, both alive and  well.   SOCIAL HISTORY:  reports that she has been smoking Cigarettes.  She has been smoking about 1.00 pack per day. She has never used smokeless tobacco. She reports that she drinks alcohol. She reports that she does not use illicit drugs. Married, no children. She works as a Engineer, maintenance (IT).   ALLERGIES: Review of patient's allergies indicates no known allergies.   MEDICATIONS:  Current Outpatient Prescriptions  Medication Sig Dispense Refill  . ALFALFA PO Take by mouth daily.      . Cholecalciferol (VITAMIN D-3 PO) Take by mouth daily.      . fexofenadine (ALLEGRA) 180 MG tablet Take 180 mg by mouth daily.      . fluticasone (FLONASE) 50 MCG/ACT nasal spray Daily.      . Multiple Vitamins-Minerals (MULTIVITAMIN PO) Take 1 tablet by mouth daily.      Marland Kitchen NAT-RUL PSYLLIUM SEED HUSKS PO Take by mouth daily.      . Omega-3 Fatty Acids (FISH OIL PO) Take 360 mg by mouth. Takes 1-3 daily       No current facility-administered medications for this encounter.     REVIEW OF SYSTEMS:  Pertinent items are noted in HPI.    PHYSICAL EXAM: Alert and oriented 63 year old white female appearing her stated age. Wt Readings from Last 3 Encounters:  12/15/13 134 lb 11.2 oz (61.1 kg)  03/30/12 135 lb (61.236 kg)  03/23/12 135  lb 9.6 oz (61.508 kg)   Temp Readings from Last 3 Encounters:  12/15/13 97.8 F (36.6 C) Oral  03/30/12 97.9 F (36.6 C)    BP Readings from Last 3 Encounters:  12/15/13 129/87  03/30/12 126/78  05/08/07 125/85   Pulse Readings from Last 3 Encounters:  12/15/13 82  03/30/12 72    Head and neck examination: Grossly unremarkable. Nodes: Without palpable cervical, supraclavicular, or axillary lymphadenopathy. Chest: Lungs clear. Breasts: There is a punctate biopsy wound along the upper inner quadrant of the right breast at 2:00. There is a small area of dense fibroglandular tissue at approximately 7:00. Left breast without masses or lesions. Abdomen: Without hepatomegaly.  Extremities: Without edema.   LABORATORY DATA:  Lab Results  Component Value Date   WBC 6.8 12/15/2013   HGB 14.9 12/15/2013   HCT 43.1 12/15/2013   MCV 91.3 12/15/2013   PLT 339 12/15/2013   Lab Results  Component Value Date   NA 142 12/15/2013   K 3.9 12/15/2013   CO2 27 12/15/2013   Lab Results  Component Value Date   ALT 17 12/15/2013   AST 17 12/15/2013   ALKPHOS 65 12/15/2013   BILITOT 0.53 12/15/2013      IMPRESSION: Stage 0 (Tis N0 M0) low-grade DCIS of the right breast. We discussed management options which include mastectomy versus partial mastectomy with or without radiation therapy, with without adjuvant hormone therapy. The standard of care is to offer radiation therapy following a partial mastectomy. We need to assess the extent of her disease and margins before making a final recommendation. We discussed the potential acute and late toxicities of radiation therapy. She would be a candidate for hypo-fractionated radiation therapy over a period of 3 weeks. We discussed the potential acute and late toxicities of radiation therapy. She may be a candidate for a pre-radiation therapy mammogram to confirm removal of all suspicious microcalcifications. I can see her a postoperatively for a followup visit with a final recommendation.   PLAN: As discussed above.  I spent 40 minutes minutes face to face with the patient and more than 50% of that time was spent in counseling and/or coordination of care.

## 2013-12-15 NOTE — Telephone Encounter (Signed)
Faxed [facility] [facility] form to [place].  Completed ROI and took to [facility] to scan.

## 2013-12-20 ENCOUNTER — Telehealth: Payer: Self-pay | Admitting: *Deleted

## 2013-12-20 NOTE — Telephone Encounter (Signed)
Called and spoke with patient from [facility] 12/22/13.  No questions or concerns at this time.  Encouraged patient to call with any needs.

## 2013-12-21 ENCOUNTER — Telehealth: Payer: Self-pay | Admitting: *Deleted

## 2013-12-21 NOTE — Telephone Encounter (Signed)
Called and spoke with patient and confirmed appointment with Dr. Jana Hakim for 02/10/14 at 3pm.

## 2013-12-27 ENCOUNTER — Encounter (HOSPITAL_COMMUNITY): Payer: Self-pay | Admitting: Pharmacy Technician

## 2013-12-28 NOTE — Pre-Procedure Instructions (Signed)
Catherine [name]  12/28/2013   Your procedure is scheduled on:  Tues, Feb 24 @ 11:00 AM  Report to [facility] [name] Short Stay Entrance A at 9:00 AM.  Call this number if you have problems the morning of surgery: [phone]   Remember:   Do not eat food or drink liquids after midnight.   Take these medicines the morning of surgery with A SIP OF WATER: Allegra(Fexofenadine) and Nasacort(Triamcinolone)              Stop taking your Fish Oil,Alfalfa,and Psyllium. No Goody's,BC's,Aleve,Aspirin,Ibuprofen,or any Herbal Medications   Do not wear jewelry, make-up or nail polish.  Do not wear lotions, powders, or perfumes.   Do not shave 48 hours prior to surgery.   Do not bring valuables to the hospital.  [facility] is not responsible                  for any belongings or valuables.               Contacts, dentures or bridgework may not be worn into surgery.  Leave suitcase in the car. After surgery it may be brought to your room.  For patients admitted to the hospital, discharge time is determined by your                treatment team.               Patients discharged the day of surgery will not be allowed to drive  home.    Special Instructions:  Catherine [name] - Preparing for Surgery  Before surgery, you can play an important role.  Because skin is not sterile, your skin needs to be as free of germs as possible.  You can reduce the number of germs on you skin by washing with CHG (chlorahexidine gluconate) soap before surgery.  CHG is an antiseptic cleaner which kills germs and bonds with the skin to continue killing germs even after washing.  Please DO NOT use if you have an allergy to CHG or antibacterial soaps.  If your skin becomes reddened/irritated stop using the CHG and inform your nurse when you arrive at Short Stay.  Do not shave (including legs and underarms) for at least 48 hours prior to the first CHG shower.  You may shave your face.  Please follow these instructions  carefully:   1.  Shower with CHG Soap the night before surgery and the                                morning of Surgery.  2.  If you choose to wash your hair, wash your hair first as usual with your       normal shampoo.  3.  After you shampoo, rinse your hair and body thoroughly to remove the                      Shampoo.  4.  Use CHG as you would any other liquid soap.  You can apply chg directly       to the skin and wash gently with scrungie or a clean washcloth.  5.  Apply the CHG Soap to your body ONLY FROM THE NECK DOWN.        Do not use on open wounds or open sores.  Avoid contact with your eyes,       ears, mouth and  genitals (private parts).  Wash genitals (private parts)       with your normal soap.  6.  Wash thoroughly, paying special attention to the area where your surgery        will be performed.  7.  Thoroughly rinse your body with warm water from the neck down.  8.  DO NOT shower/wash with your normal soap after using and rinsing off       the CHG Soap.  9.  Pat yourself dry with a clean towel.            10.  Wear clean pajamas.            11.  Place clean sheets on your bed the night of your first shower and do not        sleep with pets.  Day of Surgery  Do not apply any lotions/deoderants the morning of surgery.  Please wear clean clothes to the hospital/surgery center.     Please read over the following fact sheets that you were given: Pain Booklet, Coughing and Deep Breathing and Surgical Site Infection Prevention

## 2013-12-29 ENCOUNTER — Encounter (HOSPITAL_COMMUNITY): Payer: Self-pay

## 2013-12-29 ENCOUNTER — Encounter (HOSPITAL_COMMUNITY)
Admission: RE | Admit: 2013-12-29 | Discharge: 2013-12-29 | Disposition: A | Discharge status: Home / Self Care | Payer: BC Managed Care – PPO | Source: Ambulatory Visit | Attending: General Surgery | Admitting: General Surgery

## 2013-12-29 DIAGNOSIS — Z01812 Encounter for preprocedural laboratory examination: Secondary | ICD-10-CM | POA: Insufficient documentation

## 2013-12-29 HISTORY — DX: Other specified postprocedural states: R11.2

## 2013-12-29 HISTORY — DX: Other specified postprocedural states: Z98.890

## 2013-12-29 HISTORY — DX: Intraductal carcinoma in situ of unspecified breast: D05.10

## 2013-12-29 HISTORY — DX: Other hemorrhoids: K64.8

## 2013-12-29 HISTORY — DX: Dermatitis, unspecified: L30.9

## 2013-12-29 HISTORY — DX: Rosacea, unspecified: L71.9

## 2013-12-29 HISTORY — DX: Personal history of other infectious and parasitic diseases: Z86.19

## 2013-12-29 HISTORY — DX: Frequency of micturition: R35.0

## 2013-12-29 HISTORY — DX: Pain in unspecified joint: M25.50

## 2013-12-29 HISTORY — DX: Diverticulosis of intestine, part unspecified, without perforation or abscess without bleeding: K57.90

## 2013-12-29 HISTORY — DX: Nocturia: R35.1

## 2013-12-29 LAB — BASIC METABOLIC PANEL
BUN: 14 mg/dL (ref 6–23)
CHLORIDE: 102 meq/L (ref 96–112)
CO2: 28 mEq/L (ref 19–32)
Calcium: 10 mg/dL (ref 8.4–10.5)
Creatinine, Ser: 0.61 mg/dL (ref 0.50–1.10)
GFR calc Af Amer: >90 mL/min (ref >90)
GFR calc non Af Amer: >90 mL/min (ref >90)
Glucose, Bld: 73 mg/dL (ref 70–99)
POTASSIUM: 4.1 meq/L (ref 3.7–5.3)
Sodium: 140 mEq/L (ref 137–147)

## 2013-12-29 LAB — CBC
HCT: 43.5 % (ref 36.0–46.0)
HEMOGLOBIN: 15.3 g/dL — AB (ref 12.0–15.0)
MCH: 32.1 pg (ref 26.0–34.0)
MCHC: 35.2 g/dL (ref 30.0–36.0)
MCV: 91.4 fL (ref 78.0–100.0)
Platelets: 336 10*3/uL (ref 150–400)
RBC: 4.76 MIL/uL (ref 3.87–5.11)
RDW: 12.9 % (ref 11.5–15.5)
WBC: 6.9 10*3/uL (ref 4.0–10.5)

## 2013-12-29 MED ORDER — CHLORHEXIDINE GLUCONATE 4 % EX LIQD: 1.0000 "application " | Freq: Once | CUTANEOUS | Status: DC

## 2013-12-29 NOTE — Progress Notes (Addendum)
Pt doesn't have a cardiologist  Denies ever having a stress test/echo/heart cath  CXR done at [facility] -to be requested   Denies EKG in past yr   Medical Md is Dr.Mark Perini

## 2014-01-03 MED ORDER — CEFAZOLIN SODIUM-DEXTROSE 2-3 GM-% IV SOLR
2.0000 g | INTRAVENOUS | Status: AC
  Administered 2014-01-04: 2 g via INTRAVENOUS
  Filled 2014-01-03: qty 50

## 2014-01-04 ENCOUNTER — Ambulatory Visit
Admission: RE | Admit: 2014-01-04 | Discharge: 2014-01-04 | Disposition: A | Discharge status: Home / Self Care | Payer: BC Managed Care – PPO | Source: Ambulatory Visit | Attending: General Surgery | Admitting: General Surgery

## 2014-01-04 ENCOUNTER — Ambulatory Visit (HOSPITAL_COMMUNITY)
Admission: RE | Admit: 2014-01-04 | Discharge: 2014-01-04 | Disposition: A | Discharge status: Home / Self Care | Payer: BC Managed Care – PPO | Source: Ambulatory Visit | Attending: General Surgery | Admitting: General Surgery

## 2014-01-04 ENCOUNTER — Encounter (HOSPITAL_COMMUNITY): Payer: BC Managed Care – PPO | Admitting: Anesthesiology

## 2014-01-04 ENCOUNTER — Encounter (HOSPITAL_COMMUNITY)
Admission: RE | Disposition: A | Discharge status: Home / Self Care | Payer: Self-pay | Source: Ambulatory Visit | Attending: General Surgery

## 2014-01-04 ENCOUNTER — Ambulatory Visit (HOSPITAL_COMMUNITY): Payer: BC Managed Care – PPO | Admitting: Anesthesiology

## 2014-01-04 ENCOUNTER — Encounter (HOSPITAL_COMMUNITY): Payer: Self-pay | Admitting: *Deleted

## 2014-01-04 DIAGNOSIS — D0590 Unspecified type of carcinoma in situ of unspecified breast: Secondary | ICD-10-CM

## 2014-01-04 DIAGNOSIS — C50211 Malignant neoplasm of upper-inner quadrant of right female breast: Secondary | ICD-10-CM

## 2014-01-04 DIAGNOSIS — F17200 Nicotine dependence, unspecified, uncomplicated: Secondary | ICD-10-CM | POA: Insufficient documentation

## 2014-01-04 HISTORY — PX: BREAST LUMPECTOMY WITH NEEDLE LOCALIZATION: SHX5759

## 2014-01-04 SURGERY — BREAST LUMPECTOMY WITH NEEDLE LOCALIZATION
Anesthesia: Monitor Anesthesia Care | Site: Breast | Laterality: Right

## 2014-01-04 MED ORDER — PHENYLEPHRINE HCL 10 MG/ML IJ SOLN
INTRAMUSCULAR | Status: DC | PRN
  Administered 2014-01-04: 80 ug via INTRAVENOUS
  Administered 2014-01-04 (×2): 40 ug via INTRAVENOUS
  Administered 2014-01-04: 120 ug via INTRAVENOUS
  Administered 2014-01-04: 80 ug via INTRAVENOUS

## 2014-01-04 MED ORDER — FENTANYL CITRATE 0.05 MG/ML IJ SOLN
INTRAMUSCULAR | Status: DC | PRN
  Administered 2014-01-04 (×2): 50 ug via INTRAVENOUS

## 2014-01-04 MED ORDER — LIDOCAINE HCL (CARDIAC) 20 MG/ML IV SOLN
INTRAVENOUS | Status: AC
  Filled 2014-01-04: qty 5

## 2014-01-04 MED ORDER — LACTATED RINGERS IV SOLN
INTRAVENOUS | Status: DC
  Administered 2014-01-04 (×2): via INTRAVENOUS

## 2014-01-04 MED ORDER — LIDOCAINE HCL (PF) 1 % IJ SOLN
INTRAMUSCULAR | Status: DC | PRN
  Administered 2014-01-04: 30 mL

## 2014-01-04 MED ORDER — PROPOFOL 10 MG/ML IV EMUL
INTRAVENOUS | Status: AC
  Filled 2014-01-04: qty 100

## 2014-01-04 MED ORDER — SODIUM BICARBONATE 4 % IV SOLN
INTRAVENOUS | Status: AC
  Filled 2014-01-04: qty 5

## 2014-01-04 MED ORDER — MIDAZOLAM HCL 5 MG/5ML IJ SOLN
INTRAMUSCULAR | Status: DC | PRN
  Administered 2014-01-04 (×2): 1 mg via INTRAVENOUS

## 2014-01-04 MED ORDER — BUPIVACAINE-EPINEPHRINE (PF) 0.5% -1:200000 IJ SOLN
INTRAMUSCULAR | Status: AC
  Filled 2014-01-04: qty 10

## 2014-01-04 MED ORDER — LIDOCAINE HCL (PF) 1 % IJ SOLN
INTRAMUSCULAR | Status: AC
  Filled 2014-01-04: qty 30

## 2014-01-04 MED ORDER — PROPOFOL INFUSION 10 MG/ML OPTIME
INTRAVENOUS | Status: DC | PRN
  Administered 2014-01-04: 120 ug/kg/min via INTRAVENOUS

## 2014-01-04 MED ORDER — EPHEDRINE SULFATE 50 MG/ML IJ SOLN
INTRAMUSCULAR | Status: DC | PRN
  Administered 2014-01-04: 10 mg via INTRAVENOUS

## 2014-01-04 MED ORDER — ONDANSETRON HCL 4 MG/2ML IJ SOLN
INTRAMUSCULAR | Status: AC
  Filled 2014-01-04: qty 2

## 2014-01-04 MED ORDER — SODIUM BICARBONATE 4 % IV SOLN
INTRAVENOUS | Status: DC | PRN
  Administered 2014-01-04: 5 mL via INTRAVENOUS

## 2014-01-04 MED ORDER — FENTANYL CITRATE 0.05 MG/ML IJ SOLN
INTRAMUSCULAR | Status: AC
  Filled 2014-01-04: qty 5

## 2014-01-04 MED ORDER — 0.9 % SODIUM CHLORIDE (POUR BTL) OPTIME
TOPICAL | Status: DC | PRN
  Administered 2014-01-04: 1000 mL

## 2014-01-04 MED ORDER — BUPIVACAINE-EPINEPHRINE 0.5% -1:200000 IJ SOLN
INTRAMUSCULAR | Status: DC | PRN
  Administered 2014-01-04: 30 mL

## 2014-01-04 MED ORDER — HYDROCODONE-ACETAMINOPHEN 5-325 MG PO TABS: 1.0000 | ORAL_TABLET | ORAL | Status: DC | PRN

## 2014-01-04 SURGICAL SUPPLY — 45 items
ADH SKN CLS APL DERMABOND .7 (GAUZE/BANDAGES/DRESSINGS) ×1
BLADE SURG 10 STRL SS (BLADE) IMPLANT
BLADE SURG 15 STRL LF DISP TIS (BLADE) ×1 IMPLANT
BLADE SURG 15 STRL SS (BLADE)
CANISTER SUCTION 2500CC (MISCELLANEOUS) IMPLANT
CHLORAPREP W/TINT 26ML (MISCELLANEOUS) ×3 IMPLANT
CLIP TI MEDIUM 6 (CLIP) ×3 IMPLANT
COVER SURGICAL LIGHT HANDLE (MISCELLANEOUS) ×3 IMPLANT
DERMABOND ADVANCED (GAUZE/BANDAGES/DRESSINGS) ×2
DERMABOND ADVANCED .7 DNX12 (GAUZE/BANDAGES/DRESSINGS) ×1 IMPLANT
DEVICE DUBIN SPECIMEN MAMMOGRA (MISCELLANEOUS) ×3 IMPLANT
DRAPE CHEST BREAST 15X10 FENES (DRAPES) ×3 IMPLANT
DRAPE UTILITY 15X26 W/TAPE STR (DRAPE) ×6 IMPLANT
ELECT COATED BLADE 2.86 ST (ELECTRODE) ×3 IMPLANT
ELECT REM PT RETURN 9FT ADLT (ELECTROSURGICAL) ×3
ELECTRODE REM PT RTRN 9FT ADLT (ELECTROSURGICAL) ×1 IMPLANT
GLOVE BIOGEL PI IND STRL 7.0 (GLOVE) IMPLANT
GLOVE BIOGEL PI IND STRL 8 (GLOVE) ×1 IMPLANT
GLOVE BIOGEL PI INDICATOR 7.0 (GLOVE) ×2
GLOVE BIOGEL PI INDICATOR 8 (GLOVE) ×2
GLOVE SS BIOGEL STRL SZ 7.5 (GLOVE) ×2 IMPLANT
GLOVE SUPERSENSE BIOGEL SZ 7.5 (GLOVE) ×4
GLOVE SURG SS PI 7.0 STRL IVOR (GLOVE) ×2 IMPLANT
GOWN STRL NON-REIN LRG LVL3 (GOWN DISPOSABLE) ×3 IMPLANT
GOWN STRL REIN XL XLG (GOWN DISPOSABLE) ×3 IMPLANT
KIT BASIN OR (CUSTOM PROCEDURE TRAY) ×3 IMPLANT
KIT MARKER MARGIN INK (KITS) ×3 IMPLANT
KIT ROOM TURNOVER OR (KITS) ×3 IMPLANT
NEEDLE HYPO 25X1 1.5 SAFETY (NEEDLE) ×3 IMPLANT
NS IRRIG 1000ML POUR BTL (IV SOLUTION) ×3 IMPLANT
PACK GENERAL/GYN (CUSTOM PROCEDURE TRAY) ×3 IMPLANT
PACK SURGICAL SETUP 50X90 (CUSTOM PROCEDURE TRAY) IMPLANT
PAD ARMBOARD 7.5X6 YLW CONV (MISCELLANEOUS) ×3 IMPLANT
PENCIL BUTTON HOLSTER BLD 10FT (ELECTRODE) ×1 IMPLANT
SLEEVE SURGEON STRL (DRAPES) ×3 IMPLANT
SPONGE LAP 18X18 X RAY DECT (DISPOSABLE) IMPLANT
SUT MON AB 5-0 PS2 18 (SUTURE) ×3 IMPLANT
SUT VIC AB 3-0 SH 18 (SUTURE) ×3 IMPLANT
SYR BULB 3OZ (MISCELLANEOUS) IMPLANT
SYR CONTROL 10ML LL (SYRINGE) ×3 IMPLANT
TOWEL OR 17X24 6PK STRL BLUE (TOWEL DISPOSABLE) IMPLANT
TOWEL OR 17X26 10 PK STRL BLUE (TOWEL DISPOSABLE) ×3 IMPLANT
TUBE CONNECTING 12'X1/4 (SUCTIONS)
TUBE CONNECTING 12X1/4 (SUCTIONS) IMPLANT
YANKAUER SUCT BULB TIP NO VENT (SUCTIONS) IMPLANT

## 2014-01-04 NOTE — Transfer of Care (Signed)
Immediate Anesthesia Transfer of Care Note  Patient: Catherine [name]  Procedure(s) Performed: Procedure(s): BREAST LUMPECTOMY WITH NEEDLE LOCALIZATION (Right)  Patient Location: PACU  Anesthesia Type:MAC  Level of Consciousness: awake, alert , oriented and patient cooperative  Airway & Oxygen Therapy: Patient Spontanous Breathing and Patient connected to nasal cannula oxygen  Post-op Assessment: Report given to PACU RN and Post -op Vital signs reviewed and stable  Post vital signs: Reviewed and stable  Complications: No apparent anesthesia complications

## 2014-01-04 NOTE — Progress Notes (Signed)
Report given to maria rn as caregiver 

## 2014-01-04 NOTE — Interval H&P Note (Signed)
History and Physical Interval Note:  01/04/2014 10:55 AM  Catherine [name]  has presented today for surgery, with the diagnosis of DCIS RIGHT BREAST   The various methods of treatment have been discussed with the patient and family. After consideration of risks, benefits and other options for treatment, the patient has consented to  Procedure(s): BREAST LUMPECTOMY WITH NEEDLE LOCALIZATION (Right) as a surgical intervention .  The patient's history has been reviewed, patient examined, no change in status, stable for surgery.  I have reviewed the patient's chart and labs.  Questions were answered to the patient's satisfaction.     Nayana Lenig T

## 2014-01-04 NOTE — Discharge Instructions (Signed)
[facility] °Office Phone Number [phone] ° °BREAST BIOPSY/ PARTIAL MASTECTOMY: POST OP INSTRUCTIONS ° °Always review your discharge instruction sheet given to you by the facility where your surgery was performed. ° °IF YOU HAVE DISABILITY OR FAMILY LEAVE FORMS, YOU MUST BRING THEM TO THE OFFICE FOR PROCESSING.  DO NOT GIVE THEM TO YOUR DOCTOR. ° °1. A prescription for pain medication may be given to you upon discharge.  Take your pain medication as prescribed, if needed.  If narcotic pain medicine is not needed, then you may take acetaminophen (Tylenol) or ibuprofen (Advil) as needed. °2. Take your usually prescribed medications unless otherwise directed °3. If you need a refill on your pain medication, please contact your pharmacy.  They will contact our office to request authorization.  Prescriptions will not be filled after 5pm or on week-ends. °4. You should eat very light the first 24 hours after surgery, such as soup, crackers, pudding, etc.  Resume your normal diet the day after surgery. °5. Most patients will experience some swelling and bruising in the breast.  Ice packs and a good support bra will help.  Swelling and bruising can take several days to resolve.  °6. It is common to experience some constipation if taking pain medication after surgery.  Increasing fluid intake and taking a stool softener will usually help or prevent this problem from occurring.  A mild laxative (Milk of Magnesia or Miralax) should be taken according to package directions if there are no bowel movements after 48 hours. °7. Unless discharge instructions indicate otherwise, you may remove your bandages 24-48 hours after surgery, and you may shower at that time.  You may have steri-strips (small skin tapes) in place directly over the incision.  These strips should be left on the skin for 7-10 days.  If your surgeon used skin glue on the incision, you may shower in 24 hours.  The glue will flake off over the  next 2-3 weeks.  Any sutures or staples will be removed at the office during your follow-up visit. °8. ACTIVITIES:  You may resume regular daily activities (gradually increasing) beginning the next day.  Wearing a good support bra or sports bra minimizes pain and swelling.  You may have sexual intercourse when it is comfortable. °a. You may drive when you no longer are taking prescription pain medication, you can comfortably wear a seatbelt, and you can safely maneuver your car and apply brakes. °b. RETURN TO WORK:  ______________________________________________________________________________________ °9. You should see your doctor in the office for a follow-up appointment approximately two weeks after your surgery.  Your doctor’s nurse will typically make your follow-up appointment when she calls you with your pathology report.  Expect your pathology report 2-3 business days after your surgery.  You may call to check if you do not hear from us after three days. °10. OTHER INSTRUCTIONS: _______________________________________________________________________________________________ _____________________________________________________________________________________________________________________________________ °_____________________________________________________________________________________________________________________________________ °_____________________________________________________________________________________________________________________________________ ° °WHEN TO CALL YOUR DOCTOR: °1. Fever over 101.0 °2. Nausea and/or vomiting. °3. Extreme swelling or bruising. °4. Continued bleeding from incision. °5. Increased pain, redness, or drainage from the incision. ° °The clinic staff is available to answer your questions during regular business hours.  Please don’t hesitate to call and ask to speak to one of the nurses for clinical concerns.  If you have a medical emergency, go to the nearest  emergency room or call 911.  A surgeon from [facility] is always on call at the hospital. ° °For further questions, please visit [email or web address]  ° ° °  What to eat: ° °For your first meals, you should eat lightly; only small meals initially.  If you do not have nausea, you may eat larger meals.  Avoid spicy, greasy and heavy food.   ° °General Anesthesia, Adult, Care After  °Refer to this sheet in the next few weeks. These instructions provide you with information on caring for yourself after your procedure. Your health care provider may also give you more specific instructions. Your treatment has been planned according to current medical practices, but problems sometimes occur. Call your health care provider if you have any problems or questions after your procedure.  °WHAT TO EXPECT AFTER THE PROCEDURE  °After the procedure, it is typical to experience:  °Sleepiness.  °Nausea and vomiting. °HOME CARE INSTRUCTIONS  °For the first 24 hours after general anesthesia:  °Have a responsible person with you.  °Do not drive a car. If you are alone, do not take public transportation.  °Do not drink alcohol.  °Do not take medicine that has not been prescribed by your health care provider.  °Do not sign important papers or make important decisions.  °You may resume a normal diet and activities as directed by your health care provider.  °Change bandages (dressings) as directed.  °If you have questions or problems that seem related to general anesthesia, call the hospital and ask for the anesthetist or anesthesiologist on call. °SEEK MEDICAL CARE IF:  °You have nausea and vomiting that continue the day after anesthesia.  °You develop a rash. °SEEK IMMEDIATE MEDICAL CARE IF:  °You have difficulty breathing.  °You have chest pain.  °You have any allergic problems. °Document Released: 02/03/2001 Document Revised: 06/30/2013 Document Reviewed: 05/13/2013  °Exit[employer]® Patient Information ©2014 [employer].   ° ° °

## 2014-01-04 NOTE — Preoperative (Signed)
Beta Blockers   Reason not to administer Beta Blockers:Not Applicable 

## 2014-01-04 NOTE — Op Note (Signed)
Preoperative Diagnosis: DCIS RIGHT BREAST   Postoprative Diagnosis: DCIS RIGHT BREAST   Procedure: Procedure(s): BREAST LUMPECTOMY WITH NEEDLE LOCALIZATION   Surgeon: Excell Seltzer T   Anesthesia:  Monitored Local Anesthesia with Sedation  Indications: patient is a 63 year old female with a recent core biopsy of the medial right breast revealing ductal carcinoma in situ. On imaging including MRI this measures approximately 1 cm. After discussion of treatment options we have elected to proceed with needle localized lumpectomy his initial surgical treatment. The nature of the surgery and indications and risks have been discussed extensively and detailed elsewhere.  Procedure Detail:  Following accurate needle localization at the breast center the patient is brought to the operating room, placed in the supine position on the operating table and IV sedation was administered. The right breast was widely sterilely prepped and draped. She received preoperative IV antibiotics. Patient time out was performed.  The skin over the marked incision site and underlying breast tissue was extensively infiltrated with local anesthesia. I made a curvilinear incision just medial to the wire insertion site which was directed medial to lateral, and dissection was carried down to the subcutaneous tissue. A short skin and subcutaneous flap was raised at to the wire insertion site and the wire brought into the incision. I then excised a generous specimen of breast tissue around the shaft of the wire in an effort to obtain a negative margin. There was some slight thickening in the midst of the specimen but the surrounding tissue appeared normal margins. The dissection was taken down so that the deep margin was on the chest wall in this rather thin area of the medial breast. The specimen was removed and inked for margins. Specimen mammography was obtained which showed the marking clip well centered in the specimen. This was  sent for permanent pathology. Hemostasis was assured with cautery. The lumpectomy cavity was marked with clips. Deep and subcutaneous tissues closed with interrupted 3-0 Vicryl and the skin with subcuticular 4-0 Monocryl and Dermabond. Sponge needle and instrument counts were correct.   Estimated Blood Loss:  Minimal         Drains: none  Blood Given: none          Specimens: left breast lumpectomy for permanent pathology        Complications:  * No complications entered in OR log *         Disposition: PACU - hemodynamically stable.         Condition: stable

## 2014-01-04 NOTE — Anesthesia Procedure Notes (Signed)
Procedure Name: MAC Date/Time: 01/04/2014 11:20 AM Performed by: Trixie Deis A Pre-anesthesia Checklist: Patient identified, Timeout performed, Emergency Drugs available, Suction available and Patient being monitored Patient Re-evaluated:Patient Re-evaluated prior to inductionOxygen Delivery Method: Nasal cannula Intubation Type: IV induction Placement Confirmation: positive ETCO2 and breath sounds checked- equal and bilateral

## 2014-01-04 NOTE — Anesthesia Postprocedure Evaluation (Signed)
  Anesthesia Post-op Note  Patient: Catherine [name]  Procedure(s) Performed: Procedure(s): BREAST LUMPECTOMY WITH NEEDLE LOCALIZATION (Right)  Patient Location: PACU  Anesthesia Type:General  Level of Consciousness: awake, alert , oriented and patient cooperative  Airway and Oxygen Therapy: Patient Spontanous Breathing  Post-op Pain: mild  Post-op Assessment: Post-op Vital signs reviewed, Patient's Cardiovascular Status Stable, Respiratory Function Stable, Patent Airway, No signs of Nausea or vomiting and Pain level controlled  Post-op Vital Signs: stable  Complications: No apparent anesthesia complications

## 2014-01-04 NOTE — Anesthesia Preprocedure Evaluation (Signed)
Anesthesia Evaluation  Patient identified by MRN, date of birth, ID band Patient awake    Reviewed: Allergy & Precautions, H&P , NPO status , Patient's Chart, lab work & pertinent test results  History of Anesthesia Complications (+) PONV  Airway       Dental   Pulmonary Current Smoker,          Cardiovascular     Neuro/Psych    GI/Hepatic   Endo/Other    Renal/GU      Musculoskeletal   Abdominal   Peds  Hematology   Anesthesia Other Findings   Reproductive/Obstetrics                           Anesthesia Physical Anesthesia Plan  ASA: II  Anesthesia Plan: MAC   Post-op Pain Management:    Induction: Intravenous  Airway Management Planned: Mask and Simple Face Mask  Additional Equipment:   Intra-op Plan:   Post-operative Plan:   Informed Consent: I have reviewed the patients History and Physical, chart, labs and discussed the procedure including the risks, benefits and alternatives for the proposed anesthesia with the patient or authorized representative who has indicated his/her understanding and acceptance.     Plan Discussed with:   Anesthesia Plan Comments:         Anesthesia Quick Evaluation

## 2014-01-04 NOTE — H&P (View-Only) (Signed)
Chief Complaint: New diagnosis of breast cancer  History:    Catherine [name] is a 63 y.o. postmenopausal female referred by Dr. Ulyess Blossom  for evaluation of recently diagnosed carcinoma of the right breast. She recently presented for a screening mamogram revealing a small area of clustered calcifications..  Subsequent imaging included diagnostic mamogram showing a 10 x 14 mm group of amorphous calcifications in the medial aspect of the right breast at the 2 to 3:00 position.  A stereotactic biopsy was performed on 12/09/2013 with pathology revealing ductal carcinoma in-situ of the breast. She is seen now in multidisciplinary clinic for initial treatment planning.  She has experienced no breast symptoms, specifically no pain or lump or skin change or nipple discharge.  She does have a personal history of fibrocystic breast changes in years past but no surgical treatment. Subsequent MRI has been performed showing a 1.2 cm area of enhancement and postbiopsy change at the known area of malignancy but no other abnormalities.  Findings at that time were the following:  Tumor size: 1.4 cm  Tumor grade: 1  Estrogen Receptor: positive Progesterone Receptor: positive    Past Medical History  Diagnosis Date  . Allergy     Past Surgical History  Procedure Laterality Date  . Uterine polyp removed    . Tonsillectomy      Current Outpatient Prescriptions  Medication Sig Dispense Refill  . ALFALFA PO Take by mouth daily.      . Cholecalciferol (VITAMIN D-3 PO) Take by mouth daily.      . fexofenadine (ALLEGRA) 180 MG tablet Take 180 mg by mouth daily.      . fluticasone (FLONASE) 50 MCG/ACT nasal spray Daily.      . Multiple Vitamins-Minerals (MULTIVITAMIN PO) Take 1 tablet by mouth daily.      Marland Kitchen NAT-RUL PSYLLIUM SEED HUSKS PO Take by mouth daily.      . Omega-3 Fatty Acids (FISH OIL PO) Take 360 mg by mouth. Takes 1-3 daily       No current facility-administered medications for this visit.     Family History  Problem Relation Age of Onset  . Colon cancer Neg Hx   . Esophageal cancer Neg Hx   . Stomach cancer Neg Hx   . Rectal cancer Neg Hx   . Bladder Cancer Brother   . Breast cancer Paternal Aunt   . Bladder Cancer Brother     History   Social History  . Marital Status: Married    Spouse Name: N/A    Number of Children: N/A  . Years of Education: N/A   Social History Main Topics  . Smoking status: Current Every Day Smoker -- 1.00 packs/day    Types: Cigarettes  . Smokeless tobacco: Never Used  . Alcohol Use: 0.0 oz/week    7-10 Cans of beer per week  . Drug Use: No  . Sexual Activity: Not on file   Other Topics Concern  . Not on file   Social History Narrative  . No narrative on file     Review of Systems Constitutional: negative Respiratory: negative Cardiovascular: negative Gastrointestinal: negative, colonoscopy is up-to-date Allergic/Immunologic: positive for rosacea     Objective:  There were no vitals taken for this visit.  General: Alert, well-developed Caucasian female, in no distress Skin: Warm and dry without rash or infection. HEENT: No palpable masses or thyromegaly. Sclera nonicteric. Pupils equal round and reactive. Oropharynx clear. Breasts: small breasts bilaterally. There is a slight area  of thickening and bruising and tenderness in the medial right breast postbiopsy change. No skin changes or definite mass or nipple discharge. Lymph nodes: No cervical, supraclavicular, or inguinal nodes palpable. Lungs: Breath sounds clear and equal without increased work of breathing Cardiovascular: Regular rate and rhythm without murmur. No JVD or edema. Peripheral pulses intact. Abdomen: Nondistended. Soft and nontender. No masses palpable. No organomegaly. No palpable hernias. Extremities: No edema or joint swelling or deformity. No chronic venous stasis changes. Neurologic: Alert and fully oriented. Gait normal.   Laboratory data:   CBC:  Lab Results  Component Value Date   WBC 6.8 12/15/2013   RBC 4.72 12/15/2013   HGB 14.9 12/15/2013   HCT 43.1 12/15/2013   PLT 339 12/15/2013  ]  [facility] Labs:  Lab Results  Component Value Date   NA 142 12/15/2013   K 3.9 12/15/2013   CO2 27 12/15/2013   BUN 14.5 12/15/2013   CREATININE 0.7 12/15/2013   CALCIUM 10.0 12/15/2013   PROT 6.8 12/15/2013   BILITOT 0.53 12/15/2013   ALKPHOS 65 12/15/2013   AST 17 12/15/2013   ALT 17 12/15/2013     Assessment  63 y.o. female with a new diagnosis of cancer of the the right breast upper inner quadrant.  Clinical 0, estrogen receptor positive. I discussed with the patient and family members present today initial surgical treatment options. We discussed options of breast conservation with lumpectomy or total mastectomy. We discussed that she would be a very good candidate for breast conservation..  After discussion they have elected to proceed with right partial mastectomy.  We discussed the indications and nature of the procedure, and expected recovery, in detail. Surgical risks including anesthetic complications, cardiorespiratory complications, bleeding, infection, wound healing complications, blood clots,  local and distant recurrence and possible need for further surgery based on the final pathology was discussed and understood. Hormonal therapy and radiation therapy have been discussed. They have been provided with literature regarding the treatment of breast cancer. She strongly wants to avoid the general anesthesia.  All questions were answered. They understand and agree to proceed and we will go ahead with scheduling.  Plan right partial mastectomy with needle localization as an outpatient under local anesthesia with sedation.  Edward Jolly MD, FACS  12/15/2013, 9:50 AM

## 2014-01-05 ENCOUNTER — Telehealth (INDEPENDENT_AMBULATORY_CARE_PROVIDER_SITE_OTHER): Payer: Self-pay | Admitting: General Surgery

## 2014-01-05 ENCOUNTER — Encounter (HOSPITAL_COMMUNITY): Payer: Self-pay | Admitting: General Surgery

## 2014-01-05 NOTE — Telephone Encounter (Signed)
Called the patient and discussed pathology report 

## 2014-01-13 ENCOUNTER — Encounter: Payer: Self-pay | Admitting: Radiation Oncology

## 2014-01-13 DIAGNOSIS — C50919 Malignant neoplasm of unspecified site of unspecified female breast: Secondary | ICD-10-CM | POA: Insufficient documentation

## 2014-01-13 DIAGNOSIS — D0510 Intraductal carcinoma in situ of unspecified breast: Secondary | ICD-10-CM | POA: Insufficient documentation

## 2014-01-13 NOTE — Progress Notes (Signed)
Location of Breast Cancer: right upper inner, 2:00 o'clock  Histology per Pathology Report:  01/04/14 Diagnosis Breast, lumpectomy, Right - BREAST PARENCHYMA SHOWING FIBROCYSTIC CHANGES WITH FOCAL USUAL DUCTAL HYPERPLASIA WITH ASSOCIATED CALCIFICATIONS. - PREVIOUS BIOPSY SITE CHANGES ARE IDENTIFIED WITHOUT RESIDUAL DUCTAL CARCINOMA IN SITU PRESENT. - NO ATYPIA OR MALIGNANCY IDENTIFIED WITHIN THE SPECIMEN. - SEE COMMENT.  12/09/13 Diagnosis Breast, right, needle core biopsy, inner - DUCTAL CARCINOMA IN SITU, SEE COMMENT. - CALCIFICATIONS IDENTIFIED.  Receptor Status: ER(100%), PR (92%), Her2-neu ()   Suspicious calcifications were found on screening mammogram.  Past/Anticipated interventions by surgeon, if any: 01/04/14 right lumpectomy  Past/Anticipated interventions by medical oncology, if any: Chemotherapy - Dr Jana Hakim: antiestrogen therapy following radiation, next appt w/Dr Magrinat on 02/10/14  Lymphedema issues, if any:  no  Pain issues, if any: no  SAFETY ISSUES:  Prior radiation? no  Pacemaker/ICD? no  Possible current pregnancy? no  Is the patient on methotrexate? no  Current Complaints / other details:  Married, Engineer, maintenance (IT), no children    Jacobo Forest, Verneita Griffes, RN 01/13/2014,1:45 PM

## 2014-01-18 ENCOUNTER — Encounter: Payer: Self-pay | Admitting: Radiation Oncology

## 2014-01-18 ENCOUNTER — Telehealth: Payer: Self-pay | Admitting: *Deleted

## 2014-01-18 ENCOUNTER — Ambulatory Visit
Admission: RE | Admit: 2014-01-18 | Discharge: 2014-01-18 | Disposition: A | Discharge status: Home / Self Care | Payer: BC Managed Care – PPO | Source: Ambulatory Visit | Attending: Radiation Oncology | Admitting: Radiation Oncology

## 2014-01-18 VITALS — BP 142/78 | HR 89 | Temp 97.5°F | Resp 20 | Wt 135.6 lb

## 2014-01-18 DIAGNOSIS — D0510 Intraductal carcinoma in situ of unspecified breast: Secondary | ICD-10-CM

## 2014-01-18 DIAGNOSIS — C50211 Malignant neoplasm of upper-inner quadrant of right female breast: Secondary | ICD-10-CM

## 2014-01-18 DIAGNOSIS — D0590 Unspecified type of carcinoma in situ of unspecified breast: Secondary | ICD-10-CM | POA: Insufficient documentation

## 2014-01-18 HISTORY — DX: Malignant neoplasm of unspecified site of unspecified female breast: C50.919

## 2014-01-18 NOTE — Progress Notes (Signed)
CC: Dr. Adonis Housekeeper, Dr. Gunnar Bulla Magrinat, Dr. Crist Infante  Followup note:  Catherine [name] is a 62 year old white female who is seen today for review in the management of her stage 0 (Tis N0 M0) of the right breast. I first saw her at the [facility] on 12/15/2013 At the time of a screening mammogram at the [facility] on 11/23/2013 she was felt to have suspicious calcifications within the right breast. Additional views showed a cluster of calcifications measure 1.0 x 0.5 x 1.4 cm at the 2 to 3:00 position. Stereotactic biopsy on 12/09/2013 was diagnostic for low-grade DCIS. Breast MR on 12/13/2013 showed minimal enhancement with recent biopsy changes within the upper inner quadrant of the right breast. She underwent a right partial mastectomy by Dr. Excell Seltzer on 01/04/2014. She was found to have fibrocystic changes with focal ductal hyperplasia with associated calcifications. Previous biopsy site changes were identified and were without evidence for residual DCIS. She is without complaints today. She is to be presented at the morning breast conference tomorrow.  Physical examination: Alert and oriented. Filed Vitals:   01/18/14 0831  BP: 142/78  Pulse: 89  Temp: 97.5 F (36.4 C)  Resp: 20   Breasts: On inspection the right breast there is a partial mastectomy wound extending from one to 4:00. The wound appears to be healing well. There are no dominant masses.  Impression: History of DCIS at time of stereotactic biopsy on 12/09/2013. This will be reviewed to confirm the diagnosis of DCIS. She will be presented at the breast cancer conference tomorrow morning. She should have a baseline right breast mammogram of the right breast  approximately 3 weeks to confirm removal of suspicious calcifications. The question at hand is whether not she actually had DCIS on her initial biopsy, and if so, would she benefit from right breast radiation for what appears to be focal low-grade DCIS. Dr. Kaylyn Layer  from [facility]  feels that there is no role for radiation therapy, regardless of nuclear grade, if there is a 1 cm margin. I explained to the patient that the benefit of radiation therapy, if recommended, is to improve local control and would have no significant impact in overall survival. A reasonable option would be to proceed with close followup (mammography) along with antiestrogen therapy which has been shown to decrease the risk for development of both DCIS and invasive cancer. This can be discussed in detail by Dr. Jana Hakim. A final decision will be made after breast conference tomorrow regarding any role for radiation therapy.  30 minutes was spent face-to-face with the patient, primarily counseling patient and coordinating her care.

## 2014-01-18 NOTE — Progress Notes (Signed)
Please see the Nurse Progress Note in the MD Initial Consult Encounter for this patient. 

## 2014-01-18 NOTE — Telephone Encounter (Signed)
Called patient to inform of test, lvm for a return call 

## 2014-01-18 NOTE — Telephone Encounter (Signed)
Called patient to inform of mammogram on 02-08-14 @ 9:00 am @ [facility], spoke with patient and she is aware of this test.

## 2014-01-19 ENCOUNTER — Encounter: Payer: Self-pay | Admitting: Radiation Oncology

## 2014-01-19 NOTE — Progress Notes (Signed)
CC: Dr. Gunnar Bulla Magrinat, Dr. Adonis Housekeeper   Chart note: The patient was presented at the Wednesday morning breast conference today. Her initial diagnosis of DCIS was confirmed by Dr. Lyndon Code. Her DCIS was strongly ER/PR positive and appears to be low-grade. Her partial mastectomy pathology  was without evidence for residual DCIS although there was ductal hyperplasia with associated calcifications. She is scheduled for a right breast mammogram at the [facility] in approximately 3 weeks to rule out residual microcalcifications. Risk factors for local recurrence with DCIS include surgical margins, nuclear grade, size, and patient age. By telephone, I spoke with the patient this morning and explained to her that she would probably have no higher than 10% local recurrence rate at 10 years doing nothing else. This could be decreased to a 3-5% risk with radiation therapy (16 fractions of hypofractionated treatment). On other hand, she could have a similar risk reduction of approximately 40% with 5 years of antiestrogen therapy, in addition to reduce the risk for development of a new cancer in either breast by approximately 40% as well. I explained her that up to one half of recurrences are invasive and have the potential for metastasizing. Overall, there is no survival benefit for either radiation therapy or antiestrogen therapy. On one extreme she could do nothing else, and on the other extreme she could do radiation therapy and  antiestrogen therapy. There is no "right answer". She'll meet with Dr. Jana Hakim in early April to discuss antiestrogen therapy. She is to contact me if she wants to consider radiation therapy.

## 2014-01-20 ENCOUNTER — Ambulatory Visit (INDEPENDENT_AMBULATORY_CARE_PROVIDER_SITE_OTHER): Payer: BC Managed Care – PPO | Admitting: General Surgery

## 2014-01-20 ENCOUNTER — Encounter (INDEPENDENT_AMBULATORY_CARE_PROVIDER_SITE_OTHER): Payer: Self-pay | Admitting: General Surgery

## 2014-01-20 VITALS — BP 126/70 | HR 76 | Temp 98.0°F | Resp 16 | Ht 66.0 in | Wt 132.0 lb

## 2014-01-20 DIAGNOSIS — C50219 Malignant neoplasm of upper-inner quadrant of unspecified female breast: Secondary | ICD-10-CM

## 2014-01-20 DIAGNOSIS — C50211 Malignant neoplasm of upper-inner quadrant of right female breast: Secondary | ICD-10-CM

## 2014-01-20 NOTE — Progress Notes (Signed)
Chief complaint: Followup lumpectomy for DCIS  History: Patient returns for followup status post needle localized right lumpectomy for ductal carcinoma in situ. Her pathology showed biopsy site changes but no residual disease. The margins were widely negative. She has seen Dr. Valere Dross from radiation oncology and she has elected no additional treatment although she still has a consultation with medical oncology. She reports no problems from surgery.  Exam: BP 126/70  Pulse 76  Temp(Src) 98 F (36.7 C) (Oral)  Resp 16  Ht 5\' 6"  (1.676 m)  Wt 132 lb (59.875 kg)  BMI 21.32 kg/m2 Breasts: Right breast incision is well healed without hematoma or infection or other complication  Assessment and plan: Doing well following lumpectomy for DCIS right breast. Obviously a small lesion in margins widely negative. She has elected observation only although has not finished her consultation with medical oncology. I would like to see her back in one year following her annual mammogram. She does have a repeat mammogram pending to rule out any residual calcifications.

## 2014-02-08 ENCOUNTER — Ambulatory Visit
Admission: RE | Admit: 2014-02-08 | Discharge: 2014-02-08 | Disposition: A | Discharge status: Home / Self Care | Payer: BC Managed Care – PPO | Source: Ambulatory Visit | Attending: Radiation Oncology | Admitting: Radiation Oncology

## 2014-02-08 DIAGNOSIS — D0510 Intraductal carcinoma in situ of unspecified breast: Secondary | ICD-10-CM

## 2014-02-10 ENCOUNTER — Ambulatory Visit (HOSPITAL_BASED_OUTPATIENT_CLINIC_OR_DEPARTMENT_OTHER): Payer: BC Managed Care – PPO | Admitting: Oncology

## 2014-02-10 ENCOUNTER — Telehealth: Payer: Self-pay | Admitting: Oncology

## 2014-02-10 VITALS — BP 161/90 | HR 112 | Temp 97.5°F | Resp 18 | Ht 66.0 in | Wt 132.3 lb

## 2014-02-10 DIAGNOSIS — D0510 Intraductal carcinoma in situ of unspecified breast: Secondary | ICD-10-CM

## 2014-02-10 DIAGNOSIS — D0590 Unspecified type of carcinoma in situ of unspecified breast: Secondary | ICD-10-CM

## 2014-02-10 DIAGNOSIS — C50211 Malignant neoplasm of upper-inner quadrant of right female breast: Secondary | ICD-10-CM

## 2014-02-10 DIAGNOSIS — Z170 Estrogen receptor positive status [ER+]: Secondary | ICD-10-CM

## 2014-02-10 DIAGNOSIS — M949 Disorder of cartilage, unspecified: Secondary | ICD-10-CM

## 2014-02-10 DIAGNOSIS — M899 Disorder of bone, unspecified: Secondary | ICD-10-CM

## 2014-02-10 DIAGNOSIS — F17200 Nicotine dependence, unspecified, uncomplicated: Secondary | ICD-10-CM

## 2014-02-10 NOTE — Progress Notes (Signed)
[facility]  Telephone:([phone]) [phone] Fax:([phone]) [phone]     ID: [name] OB: [birth year]  MR#: [id number]  JHE#:[id number]  PCP: [name], MD GYN:  [name] SU: Excell Seltzer OTHER MD: [name]  CHIEF COMPLAINT: "I have a low-grade breast cancer".  HISTORY OF PRESENT ILLNESS: Kanetra underwent routine bilateral screening mammography at the breast center 11/23/2013. Calcifications were noted in the right breast, and a right unilateral diagnostic mammogram 12/06/2013 confirmed a 10 mm group of amorphous calcifications in the upper inner quadrant of the right breast. Biopsy of this area on 12/09/2013 showed ([id number]) ductal carcinoma in situ, low-grade, strongly estrogen and progesterone receptor positive.  On 12/13/2013 the patient underwent bilateral breast MRI. This showed, in the upper-inner quadrant of the right breast, an area of enhancement along the biopsy tract measuring 1.2 cm, with no other areas of suspicious enhancement in either breast and no evidence of abnormal adenopathy.  The patient's subsequent history is as detailed below  INTERVAL HISTORY: Dalina returns today for followup of her breast cancer accompanied by her husband [name]. Since her last visit here she had her definitive lumpectomy which actually showed no residual cancer. She also had a repeat mammogram which showed the original calcifications have all been removed  REVIEW OF SYSTEMS: She did well with the surgery, with no problems relating to bleeding, pain, fever, or infection. She continues to exercise just about every day. She is very faithful at drinking milk and eating yogurt. Aside from her rosacea and some hot flashes a detailed review of systems today was negative  PAST MEDICAL HISTORY: Past Medical History  Diagnosis Date  . Allergy     takes Allegra daily and Nasacort daily  . Rosacea     uses topical Metrogel daily as needed  . Eczema     uses  topical Hydrocortisone daily as needed  . PONV (postoperative nausea and vomiting)   . Joint pain   . Diverticulosis   . Internal hemorrhoids   . Urinary frequency   . Nocturia   . History of shingles   . DCIS (ductal carcinoma in situ) 11/2013    right breast  . Breast cancer 12/09/13    right, upper inner, 2:00 o'clock    PAST SURGICAL HISTORY: Past Surgical History  Procedure Laterality Date  . Uterine polyp removed  5+yrs ago  . Basal cell carcinoma excision      From the right cheek  . Tonsillectomy      at age 63  . Colonoscopy    . Breast lumpectomy with needle localization Right 01/04/2014    Procedure: BREAST LUMPECTOMY WITH NEEDLE LOCALIZATION;  Surgeon: Edward Jolly, MD;  Location: [facility];  Service: General;  Laterality: Right;  . Breast biopsy Right 12/09/13    right upper inner    FAMILY HISTORY Family History  Problem Relation Age of Onset  . Colon cancer Neg Hx   . Esophageal cancer Neg Hx   . Stomach cancer Neg Hx   . Rectal cancer Neg Hx   . Bladder Cancer Brother   . Breast cancer Paternal Aunt 12    lived to be 32  . Bladder Cancer Brother   . Alzheimer's disease Mother   . Heart attack Father   . ALS Brother    the patient's father died at the age of 16 following intracranial surgery to relieve what sounds like a subdural hematoma. The patient's mother died with Alzheimer's disease at the age  of 42. The patient has 4 brothers, one sister. The only breast cancer in the family is a paternal aunt diagnosed at age 26. The patient does have 2 brothers diagnosed with bladder cancer in their 21s.  GYNECOLOGIC HISTORY:  Menarche age 39. The patient is GX P0. She stopped having periods approximately 2009. She did not use hormone replacement.  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 Her husband [name] used to work for [employer], but is now retired. They live alone, with no pets. They attend [employer] [place] .    ADVANCED  DIRECTIVES: Not in place   HEALTH MAINTENANCE: History  Substance Use Topics  . Smoking status: Current Every Day Smoker -- 0.25 packs/day for 40 years    Types: Cigarettes  . Smokeless tobacco: Never Used     Comment: hx 1 PPD  . Alcohol Use: 0.0 oz/week     Comment: 1 beer daily     Colonoscopy: 2013  PAP:  Bone density: 05/11/2012 with a T score of -2.0  Lipid panel:  No Known Allergies  Current Outpatient Prescriptions  Medication Sig Dispense Refill  . ALFALFA PO Take 2 tablets by mouth daily.       . Cholecalciferol (VITAMIN D-3 PO) Take 1,000 Units by mouth daily.       . fexofenadine (ALLEGRA) 180 MG tablet Take 180 mg by mouth daily.      Marland Kitchen HYDROCORTISONE EX Apply 1 application topically daily as needed (for eczema).      . metroNIDAZOLE (METROGEL) 1 % gel Apply 1 application topically daily. Applies to facial rosacea.      . Multiple Vitamins-Minerals (MULTIVITAMIN PO) Take 1 tablet by mouth daily.      . Multiple Vitamins-Minerals (ZINC PO) Take 1 tablet by mouth daily as needed (for exposure to common cold).      . Omega-3 Fatty Acids (FISH OIL) 1200 MG CAPS Take 1,200 mg by mouth daily.      . Psyllium (NAT-RUL PSYLLIUM SEED HUSKS) 500 MG CAPS Take 1,000 mg by mouth 2 (two) times daily.      Marland Kitchen triamcinolone (NASACORT AQ) 55 MCG/ACT AERO nasal inhaler Place 2 sprays into both nostrils daily.       No current facility-administered medications for this visit.    OBJECTIVE: middle-aged white woman who appears stated age  22 Vitals:   02/10/14 1501  BP: 161/90  Pulse: 112  Temp: 97.5 F (36.4 C)  Resp: 18     Body mass index is 21.36 kg/(m^2).    ECOG FS:0 - Asymptomatic  Ocular: Sclerae unicteric, pupils equal and round  Ear-nose-throat: Oropharynx clear, dentition in good repair Lymphatic: No cervical or supraclavicular adenopathy Lungs no rales or rhonchi, good excursion bilaterally Heart regular rate and rhythm, no murmur appreciated Abd soft,  nontender, positive bowel sounds MSK no focal spinal tenderness, noupper extremity edema  Neuro: non-focal, well-oriented,  appropriate affect Breasts: The right breast is status post recent  lumpectomy. the incision is healing nicely. The cosmetic result is excellent.  The right axilla is benign. The left breast is unremarkable   LAB RESULTS:  CMP     Component Value Date/Time   NA 140 12/29/2013 0936   NA 142 12/15/2013 0835   K 4.1 12/29/2013 0936   K 3.9 12/15/2013 0835   CL 102 12/29/2013 0936   CO2 28 12/29/2013 0936   CO2 27 12/15/2013 0835   GLUCOSE 73 12/29/2013 0936   GLUCOSE 80 12/15/2013 0835  BUN 14 12/29/2013 0936   BUN 14.5 12/15/2013 0835   CREATININE 0.61 12/29/2013 0936   CREATININE 0.7 12/15/2013 0835   CALCIUM 10.0 12/29/2013 0936   CALCIUM 10.0 12/15/2013 0835   PROT 6.8 12/15/2013 0835   ALBUMIN 4.1 12/15/2013 0835   AST 17 12/15/2013 0835   ALT 17 12/15/2013 0835   ALKPHOS 65 12/15/2013 0835   BILITOT 0.53 12/15/2013 0835   GFRNONAA >90 12/29/2013 0936   GFRAA >90 12/29/2013 0936    I No results found for this basename: SPEP,  UPEP,   kappa and lambda light chains    Lab Results  Component Value Date   WBC 6.9 12/29/2013   NEUTROABS 3.7 12/15/2013   HGB 15.3* 12/29/2013   HCT 43.5 12/29/2013   MCV 91.4 12/29/2013   PLT 336 12/29/2013      Chemistry      Component Value Date/Time   NA 140 12/29/2013 0936   NA 142 12/15/2013 0835   K 4.1 12/29/2013 0936   K 3.9 12/15/2013 0835   CL 102 12/29/2013 0936   CO2 28 12/29/2013 0936   CO2 27 12/15/2013 0835   BUN 14 12/29/2013 0936   BUN 14.5 12/15/2013 0835   CREATININE 0.61 12/29/2013 0936   CREATININE 0.7 12/15/2013 0835      Component Value Date/Time   CALCIUM 10.0 12/29/2013 0936   CALCIUM 10.0 12/15/2013 0835   ALKPHOS 65 12/15/2013 0835   AST 17 12/15/2013 0835   ALT 17 12/15/2013 0835   BILITOT 0.53 12/15/2013 0835       No results found for this basename: LABCA2    No components found with this basename: LABCA125    No results  found for this basename: INR,  in the last 168 hours  Urinalysis No results found for this basename: colorurine,  appearanceur,  labspec,  phurine,  glucoseu,  hgbur,  bilirubinur,  ketonesur,  proteinur,  urobilinogen,  nitrite,  leukocytesur    STUDIES: Mm Digital Diagnostic Unilat R  02/08/2014   CLINICAL DATA:  Followup surgically resected right breast ductal carcinoma in situ. There was no residual ductal carcinoma in situ in the surgical specimen. There were calcifications associated with fibrocystic changes in the specimen.  EXAM: DIGITAL DIAGNOSTIC  RIGHT MAMMOGRAM WITH CAD  COMPARISON:  Previous examinations.  ACR Breast Density Category c: The breast tissue is heterogeneously dense, which may obscure small masses.  FINDINGS: Interval post lumpectomy changes in the medial right breast with surgical clips. The previously biopsied calcifications in the medial right breast are no longer present. There are no residual calcifications in that portion of the breast. There are a small number of tiny, punctate, rounded calcifications more posteriorly in the right breast which are mammographically stable over many years, compatible with a benign process such as fibrocystic change.  Mammographic images were processed with CAD.  IMPRESSION: Status post right lumpectomy with no residual calcifications seen at the lumpectomy site.  RECOMMENDATION: Bilateral diagnostic mammogram in 10 months. That will be 1 year since mammographic evaluation of the left breast.  I have discussed the findings and recommendations with the patient. Results were also provided in writing at the conclusion of the visit. If applicable, a reminder letter will be sent to the patient regarding the next appointment.  BI-RADS CATEGORY  2: Benign finding(s).   Electronically Signed   By: Gordan Payment M.D.   On: 02/08/2014 09:49   ASSESSMENT: 63 y.o.  woman status post right breast biopsy 12/09/2013  for ductal carcinoma in situ him a  low-grade, estrogen and progesterone receptor strongly positive.   (1) Status post right lumpectomy 01/04/2014 showing no residual DCIS.  (2) the patient has opted to forego adjuvant radiation or anti-estrogens  (3 osteopenia, T score of -2.0 July of 2014  (4 tobacco abuse, active  PLAN: We spent approximately 45 minutes today going over her situation, which is actually excellent. All of her tumor was removed at the original biopsy. The tumor was noninvasive. We are now talking life her death issues but recurrence. We are also talking the risk of a new breast cancer developing in either breast, which I quoted her as 1% per year.  If she took anti-estrogens the risk of local recurrence in the risk of new breast cancer both would be cut essentially in half. We then discussed the difference between anti-estrogens namely tamoxifen and anastrozole, and the possible toxicities, side effects and complications of those agents.  After much discussion was she really wants to do is not have radiation or anti-estrogens but simply observation. She is going to see Dr. Dellis Filbert in October and she will see me again next April. She will have yearly mammography with special attention to calcifications. This may not be an optimal plan, but it certainly is a reasonable one since anti-estrogens are not side effect free, and tamoxifen can cause blood clots or uterine cancer while anastrozole would certainly worsen her osteopenia a less she took bisphosphonates or similar drugs, which come with her onset of side effects  In short we are going to do observation alone. I am not uncomfortable with that decision. Since we are dealing with a noninvasive breast cancer, we are not planning to do any lab work, only review of systems in physical exam.   Taegan knows to call with any problems that may develop before the next visit here.    Chauncey Cruel, MD   02/10/2014 3:07 PM

## 2014-02-10 NOTE — Telephone Encounter (Signed)
, °

## 2014-02-11 ENCOUNTER — Telehealth: Payer: Self-pay | Admitting: *Deleted

## 2014-02-11 NOTE — Telephone Encounter (Signed)
Mailed after appt letter to pt. 

## 2014-06-03 ENCOUNTER — Encounter: Payer: Self-pay | Admitting: *Deleted

## 2014-11-14 ENCOUNTER — Other Ambulatory Visit: Payer: Self-pay | Admitting: Oncology

## 2014-11-14 DIAGNOSIS — Z9889 Other specified postprocedural states: Secondary | ICD-10-CM

## 2014-11-14 DIAGNOSIS — Z853 Personal history of malignant neoplasm of breast: Secondary | ICD-10-CM

## 2014-11-25 ENCOUNTER — Ambulatory Visit
Admission: RE | Admit: 2014-11-25 | Discharge: 2014-11-25 | Disposition: A | Discharge status: Home / Self Care | Payer: BLUE CROSS/BLUE SHIELD | Source: Ambulatory Visit | Attending: Oncology | Admitting: Oncology

## 2014-11-25 DIAGNOSIS — Z853 Personal history of malignant neoplasm of breast: Secondary | ICD-10-CM

## 2014-11-25 DIAGNOSIS — Z9889 Other specified postprocedural states: Secondary | ICD-10-CM

## 2015-02-26 NOTE — Progress Notes (Signed)
Catherine [name]  [phone]) [phone] Fax:(336) [phone]     ID: [name] OB: 10-03-1951  MR#: [id number]  DEY#:[id number]  PCP: [name], MD GYN:  [name] SU: Excell Seltzer OTHER MD: [name]  CHIEF COMPLAINT: Ductal carcinoma in situ  CURRENT TREATMENT: Observation   HISTORY OF PRESENT ILLNESS: From the original intake note:  Catherine [name] underwent routine bilateral screening mammography at the breast center 11/23/2013. Calcifications were noted in the right breast, and a right unilateral diagnostic mammogram 12/06/2013 confirmed a 10 mm group of amorphous calcifications in the upper inner quadrant of the right breast. Biopsy of this area on 12/09/2013 showed ([id number]) ductal carcinoma in situ, low-grade, strongly estrogen and progesterone receptor positive.  On 12/13/2013 the patient underwent bilateral breast MRI. This showed, in the upper-inner quadrant of the right breast, an area of enhancement along the biopsy tract measuring 1.2 cm, with no other areas of suspicious enhancement in either breast and no evidence of abnormal adenopathy.  The patient's subsequent history is as detailed below  INTERVAL HISTORY: Canyon returns today for followup of her breast cancer. The interval history is unremarkable except that she has been working extremely hard because that this is tax season.  REVIEW OF SYSTEMS: Despite the hard work she gets on her treadmill 6 days a week and she also goes to the gym 2 days a week. She is having some hot flashes which however are not a significant issue for her. She was interested in receiving a shingles shot but has not been able to find out exactly how to do it. Aside from this a detailed review of systems today was noncontributory  PAST MEDICAL HISTORY: Past Medical History  Diagnosis Date  . Allergy     takes Allegra daily and Nasacort daily  . Rosacea     uses topical Metrogel daily as needed  .  Eczema     uses topical Hydrocortisone daily as needed  . PONV (postoperative nausea and vomiting)   . Joint pain   . Diverticulosis   . Internal hemorrhoids   . Urinary frequency   . Nocturia   . History of shingles   . DCIS (ductal carcinoma in situ) 11/2013    right breast  . Breast cancer 12/09/13    right, upper inner, 2:00 o'clock    PAST SURGICAL HISTORY: Past Surgical History  Procedure Laterality Date  . Uterine polyp removed  5+yrs ago  . Basal cell carcinoma excision      From the right cheek  . Tonsillectomy      at age 34  . Colonoscopy    . Breast lumpectomy with needle localization Right 01/04/2014    Procedure: BREAST LUMPECTOMY WITH NEEDLE LOCALIZATION;  Surgeon: Edward Jolly, MD;  Location: [facility];  Service: General;  Laterality: Right;  . Breast biopsy Right 12/09/13    right upper inner    FAMILY HISTORY Family History  Problem Relation Age of Onset  . Colon cancer Neg Hx   . Esophageal cancer Neg Hx   . Stomach cancer Neg Hx   . Rectal cancer Neg Hx   . Bladder Cancer Brother   . Breast cancer Paternal Aunt 33    lived to be 19  . Bladder Cancer Brother   . Alzheimer's disease Mother   . Heart attack Father   . ALS Brother    the patient's father died at the age of 61 following intracranial surgery to relieve what sounds like  a subdural hematoma. The patient's mother died with Alzheimer's disease at the age of 54. The patient has 4 brothers, one sister. The only breast cancer in the family is a paternal aunt diagnosed at age 32. The patient does have 2 brothers diagnosed with bladder cancer in their 46s.  GYNECOLOGIC HISTORY:  Menarche age 51. The patient is GX P0. She stopped having periods approximately 2009. She did not use hormone replacement.  SOCIAL HISTORY:  Linsie works as an Optometrist. Her husband [name] used to work for [employer], but is now retired. They live alone, with no pets. They attend [employer] [place]  .    ADVANCED DIRECTIVES: Not in place   HEALTH MAINTENANCE: History  Substance Use Topics  . Smoking status: Current Every Day Smoker -- 0.25 packs/day for 40 years    Types: Cigarettes  . Smokeless tobacco: Never Used     Comment: hx 1 PPD  . Alcohol Use: 0.0 oz/week     Comment: 1 beer daily     Colonoscopy: 2013  PAP:  Bone density: 05/11/2012 with a T score of -2.0  Lipid panel:  No Known Allergies  Current Outpatient Prescriptions  Medication Sig Dispense Refill  . ALFALFA PO Take 2 tablets by mouth daily.     . Cholecalciferol (VITAMIN D-3 PO) Take 1,000 Units by mouth daily.     . fexofenadine (ALLEGRA) 180 MG tablet Take 180 mg by mouth daily.    Marland Kitchen HYDROCORTISONE EX Apply 1 application topically daily as needed (for eczema).    . metroNIDAZOLE (METROGEL) 1 % gel Apply 1 application topically daily. Applies to facial rosacea.    . Multiple Vitamins-Minerals (MULTIVITAMIN PO) Take 1 tablet by mouth daily.    . Multiple Vitamins-Minerals (ZINC PO) Take 1 tablet by mouth daily as needed (for exposure to common cold).    . Omega-3 Fatty Acids (FISH OIL) 1200 MG CAPS Take 1,200 mg by mouth daily.    . Psyllium (NAT-RUL PSYLLIUM SEED HUSKS) 500 MG CAPS Take 1,000 mg by mouth 2 (two) times daily.    Marland Kitchen triamcinolone (NASACORT AQ) 55 MCG/ACT AERO nasal inhaler Place 2 sprays into both nostrils daily.     No current facility-administered medications for this visit.    OBJECTIVE: middle-aged white woman who appears stated age  64 Vitals:   02/27/15 1049  BP: 137/66  Pulse: 65  Temp: 97.9 F (36.6 C)  Resp: 18     Body mass index is 22.53 kg/(m^2).    ECOG FS:0 - Asymptomatic  Sclerae unicteric, pupils round and equal Oropharynx clear and moist No cervical or supraclavicular adenopathy Lungs no rales or rhonchi Heart regular rate and rhythm Abd soft, nontender, positive bowel sounds  MSK no focal spinal tenderness, no upper extremity lymphedema Neurologic:  Nonfocal on my well-oriented, positive affect Breasts: The right breast is status post lumpectomy. There is a slight indentation at the site of the lumpectomy scar, without surrounding induration. In the inferior aspect of the right breast the shelf associated with the areola is easily palpable. These are not significant and are not changed from prior. There is no evidence of disease recurrence. The right axilla is benign per the left breast is unremarkable.   LAB RESULTS:  CMP     Component Value Date/Time   NA 140 12/29/2013 0936   NA 142 12/15/2013 0835   K 4.1 12/29/2013 0936   K 3.9 12/15/2013 0835   CL 102 12/29/2013 0936   CO2 28 12/29/2013  0936   CO2 27 12/15/2013 0835   GLUCOSE 73 12/29/2013 0936   GLUCOSE 80 12/15/2013 0835   BUN 14 12/29/2013 0936   BUN 14.5 12/15/2013 0835   CREATININE 0.61 12/29/2013 0936   CREATININE 0.7 12/15/2013 0835   CALCIUM 10.0 12/29/2013 0936   CALCIUM 10.0 12/15/2013 0835   PROT 6.8 12/15/2013 0835   ALBUMIN 4.1 12/15/2013 0835   AST 17 12/15/2013 0835   ALT 17 12/15/2013 0835   ALKPHOS 65 12/15/2013 0835   BILITOT 0.53 12/15/2013 0835   GFRNONAA >90 12/29/2013 0936   GFRAA >90 12/29/2013 0936    I No results found for: SPEP  Lab Results  Component Value Date   WBC 6.9 12/29/2013   NEUTROABS 3.7 12/15/2013   HGB 15.3* 12/29/2013   HCT 43.5 12/29/2013   MCV 91.4 12/29/2013   PLT 336 12/29/2013      Chemistry      Component Value Date/Time   NA 140 12/29/2013 0936   NA 142 12/15/2013 0835   K 4.1 12/29/2013 0936   K 3.9 12/15/2013 0835   CL 102 12/29/2013 0936   CO2 28 12/29/2013 0936   CO2 27 12/15/2013 0835   BUN 14 12/29/2013 0936   BUN 14.5 12/15/2013 0835   CREATININE 0.61 12/29/2013 0936   CREATININE 0.7 12/15/2013 0835      Component Value Date/Time   CALCIUM 10.0 12/29/2013 0936   CALCIUM 10.0 12/15/2013 0835   ALKPHOS 65 12/15/2013 0835   AST 17 12/15/2013 0835   ALT 17 12/15/2013 0835   BILITOT 0.53  12/15/2013 0835       No results found for: LABCA2  No components found for: BTDVV616  No results for input(s): INR in the last 168 hours.  Urinalysis No results found for: COLORURINE  STUDIES:  CLINICAL DATA: Right lumpectomy 2015.  EXAM: DIGITAL DIAGNOSTIC BILATERAL MAMMOGRAM WITH CAD  COMPARISON: 02/08/2014 and earlier  ACR Breast Density Category c: The breast tissue is heterogeneously dense, which may obscure small masses.  FINDINGS: No suspicious mass, distortion, or microcalcifications are identified to suggest presence of malignancy. Postoperative changes are seen in the right breast.  Mammographic images were processed with CAD.  IMPRESSION: No mammographic evidence for malignancy.  RECOMMENDATION: Diagnostic mammogram is suggested in 1 year. (Code:[id number]-B-01Y)  I have discussed the findings and recommendations with the patient. Results were also provided in writing at the conclusion of the visit. If applicable, a reminder letter will be sent to the patient regarding the next appointment.  BI-RADS CATEGORY 2: Benign.   Electronically Signed  By: Shon Hale M.D.  On: 11/25/2014 11:53     ASSESSMENT: 64 y.o. [place] woman status post right breast biopsy 12/09/2013 for ductal carcinoma in situ him a low-grade, estrogen and progesterone receptor strongly positive.   (1) Status post right lumpectomy 01/04/2014 showing no residual DCIS.  (2) the patient has opted to forego adjuvant radiation or anti-estrogens  (3 osteopenia, T score of -2.0 July of 2014  (4 tobacco abuse, active  PLAN: Jamilet is doing terrific as far as her noninvasive breast cancer is concerned. Today we discussed the controversy regarding whether ductal carcinoma in situ is actually a cancer or not. Because it is called a cancer, many of our patients go to extremes including bilateral mastectomies for what is not a life-threatening disease.  In her case I feel  very comfortable releasing her to her primary care physician. She understands all she needs is a yearly physician breast exam,  which she would get through her gynecologist, and a yearly mammogram, preferably with tomography, which is set up pretty much automatically but in any rate can be monitored through Dr. Silvestre Mesi office.  She is very much in favor of this arrangement. She knows that we will be glad to see her at any point in the future if and when the need arises, but as of now we're making no further routine appointments for her here.  Incidentally she was interested in receiving a shingles vaccine. Unfortunately we do not provide that. Certainly there is no contraindication for her receiving it.    Chauncey Cruel, MD   02/27/2015 10:54 AM

## 2015-02-27 ENCOUNTER — Ambulatory Visit (HOSPITAL_BASED_OUTPATIENT_CLINIC_OR_DEPARTMENT_OTHER): Payer: BLUE CROSS/BLUE SHIELD | Admitting: Oncology

## 2015-02-27 VITALS — BP 137/66 | HR 65 | Temp 97.9°F | Resp 18 | Ht 66.0 in | Wt 139.5 lb

## 2015-02-27 DIAGNOSIS — C50211 Malignant neoplasm of upper-inner quadrant of right female breast: Secondary | ICD-10-CM

## 2015-02-27 DIAGNOSIS — Z853 Personal history of malignant neoplasm of breast: Secondary | ICD-10-CM | POA: Diagnosis not present

## 2015-10-26 ENCOUNTER — Other Ambulatory Visit: Payer: Self-pay | Admitting: Oncology

## 2015-10-26 DIAGNOSIS — C50911 Malignant neoplasm of unspecified site of right female breast: Secondary | ICD-10-CM

## 2015-10-26 DIAGNOSIS — Z9889 Other specified postprocedural states: Secondary | ICD-10-CM

## 2015-11-27 ENCOUNTER — Ambulatory Visit
Admission: RE | Admit: 2015-11-27 | Discharge: 2015-11-27 | Disposition: A | Discharge status: Home / Self Care | Payer: BLUE CROSS/BLUE SHIELD | Source: Ambulatory Visit | Attending: Oncology | Admitting: Oncology

## 2015-11-27 DIAGNOSIS — C50911 Malignant neoplasm of unspecified site of right female breast: Secondary | ICD-10-CM

## 2015-11-27 DIAGNOSIS — Z9889 Other specified postprocedural states: Secondary | ICD-10-CM

## 2016-06-25 ENCOUNTER — Other Ambulatory Visit: Payer: Self-pay | Admitting: Obstetrics & Gynecology

## 2016-06-25 DIAGNOSIS — IMO0002 Reserved for concepts with insufficient information to code with codable children: Secondary | ICD-10-CM

## 2016-06-25 DIAGNOSIS — R229 Localized swelling, mass and lump, unspecified: Principal | ICD-10-CM

## 2016-07-04 ENCOUNTER — Ambulatory Visit
Admission: RE | Admit: 2016-07-04 | Discharge: 2016-07-04 | Disposition: A | Discharge status: Home / Self Care | Payer: BLUE CROSS/BLUE SHIELD | Source: Ambulatory Visit | Attending: Obstetrics & Gynecology | Admitting: Obstetrics & Gynecology

## 2016-07-04 DIAGNOSIS — IMO0002 Reserved for concepts with insufficient information to code with codable children: Secondary | ICD-10-CM

## 2016-07-04 DIAGNOSIS — R229 Localized swelling, mass and lump, unspecified: Principal | ICD-10-CM

## 2016-07-04 MED ORDER — GADOBENATE DIMEGLUMINE 529 MG/ML IV SOLN
12.0000 mL | Freq: Once | INTRAVENOUS | Status: AC | PRN
  Administered 2016-07-04: 12 mL via INTRAVENOUS

## 2016-10-25 ENCOUNTER — Other Ambulatory Visit: Payer: Self-pay | Admitting: Obstetrics & Gynecology

## 2016-10-25 DIAGNOSIS — Z853 Personal history of malignant neoplasm of breast: Secondary | ICD-10-CM

## 2016-11-28 ENCOUNTER — Ambulatory Visit
Admission: RE | Admit: 2016-11-28 | Discharge: 2016-11-28 | Disposition: A | Discharge status: Home / Self Care | Payer: BLUE CROSS/BLUE SHIELD | Source: Ambulatory Visit | Attending: Obstetrics & Gynecology | Admitting: Obstetrics & Gynecology

## 2016-11-28 DIAGNOSIS — Z853 Personal history of malignant neoplasm of breast: Secondary | ICD-10-CM

## 2017-09-29 ENCOUNTER — Encounter (HOSPITAL_COMMUNITY): Payer: Self-pay | Admitting: Emergency Medicine

## 2017-09-29 ENCOUNTER — Emergency Department (HOSPITAL_COMMUNITY)
Admission: EM | Admit: 2017-09-29 | Discharge: 2017-09-29 | Disposition: A | Discharge status: Home / Self Care | Payer: Medicare Other | Attending: Emergency Medicine | Admitting: Emergency Medicine

## 2017-09-29 DIAGNOSIS — R040 Epistaxis: Secondary | ICD-10-CM | POA: Diagnosis not present

## 2017-09-29 DIAGNOSIS — Z79899 Other long term (current) drug therapy: Secondary | ICD-10-CM | POA: Insufficient documentation

## 2017-09-29 DIAGNOSIS — F17210 Nicotine dependence, cigarettes, uncomplicated: Secondary | ICD-10-CM | POA: Diagnosis not present

## 2017-09-29 DIAGNOSIS — Z853 Personal history of malignant neoplasm of breast: Secondary | ICD-10-CM | POA: Diagnosis not present

## 2017-09-29 DIAGNOSIS — Z85828 Personal history of other malignant neoplasm of skin: Secondary | ICD-10-CM | POA: Insufficient documentation

## 2017-09-29 HISTORY — DX: Other seasonal allergic rhinitis: J30.2

## 2017-09-29 LAB — CBC WITH DIFFERENTIAL/PLATELET
Basophils Absolute: 0 10*3/uL (ref 0.0–0.1)
Basophils Relative: 0 %
EOS ABS: 0 10*3/uL (ref 0.0–0.7)
Eosinophils Relative: 0 %
HCT: 39.4 % (ref 36.0–46.0)
HEMOGLOBIN: 13.8 g/dL (ref 12.0–15.0)
Lymphocytes Relative: 24 %
Lymphs Abs: 2.5 10*3/uL (ref 0.7–4.0)
MCH: 32 pg (ref 26.0–34.0)
MCHC: 35 g/dL (ref 30.0–36.0)
MCV: 91.4 fL (ref 78.0–100.0)
Monocytes Absolute: 0.6 10*3/uL (ref 0.1–1.0)
Monocytes Relative: 5 %
Neutro Abs: 7.2 10*3/uL (ref 1.7–7.7)
Neutrophils Relative %: 71 %
Platelets: 278 10*3/uL (ref 150–400)
RBC: 4.31 MIL/uL (ref 3.87–5.11)
RDW: 13.2 % (ref 11.5–15.5)
WBC: 10.3 10*3/uL (ref 4.0–10.5)

## 2017-09-29 MED ORDER — OXYMETAZOLINE HCL 0.05 % NA SOLN
1.0000 | Freq: Once | NASAL | Status: AC
  Administered 2017-09-29: 1 via NASAL
  Filled 2017-09-29: qty 15

## 2017-09-29 MED ORDER — SILVER NITRATE-POT NITRATE 75-25 % EX MISC
1.0000 | Freq: Once | CUTANEOUS | Status: AC
  Administered 2017-09-29: 1 via TOPICAL
  Filled 2017-09-29: qty 1

## 2017-09-29 NOTE — Discharge Instructions (Signed)
Please read the attached information regarding your condition. Apply pressure to the nose as directed for 30 minutes if symptoms resume. Follow-up with your primary care provider for further evaluation. Return to ED for worsening bleeding, lightheadedness, dizziness or trouble breathing.

## 2017-09-29 NOTE — ED Triage Notes (Addendum)
Pt with intermittent nose bleeds x 3 days, pt's current episode began approximately 10am and patient states she is having difficulty getting nose bleeds controlled. Pt denies being on blood thinners. Pt reports she has not used nasocort x 2 days. Has used saline nasal spray.

## 2017-09-29 NOTE — ED Provider Notes (Signed)
[facility] DEPT Provider Note   CSN: [id number] Arrival date & time: 09/29/17  1121     History   Chief Complaint Chief Complaint  Patient presents with  . Epistaxis    HPI Catherine [name] is a 66 y.o. female with past medical history of allergies, presents to ED for evaluation of 4-day history of daily nosebleeds.  She states that for the past 3 days she has had one episode of epistaxis that has lasted approximately 5-15 minutes each.  She states that today she has had 3 that have each lasted several minutes.  No previous history of similar symptoms.  She does use daily Nasacort for her allergies which she has not used in the past 2 days.  She has used a nasal saline spray instead.  She denies any blood thinner use.  She denies any dizziness, lightheadedness.  HPI  Past Medical History:  Diagnosis Date  . Allergy    takes Allegra daily and Nasacort daily  . Breast cancer ([place]) 12/09/13   right, upper inner, 2:00 o'clock  . DCIS (ductal carcinoma in situ) 11/2013   right breast  . Diverticulosis   . Eczema    uses topical Hydrocortisone daily as needed  . History of shingles   . Internal hemorrhoids   . Joint pain   . Nocturia   . PONV (postoperative nausea and vomiting)   . Rosacea    uses topical Metrogel daily as needed  . Seasonal allergies   . Urinary frequency     Patient Active Problem List   Diagnosis Date Noted  . DCIS (ductal carcinoma in situ)   . Breast cancer of upper-inner quadrant of right female breast ([place]) 12/13/2013  . TOBACCO ABUSE 09/22/2007  . OTITIS MEDIA, CHRONIC 09/22/2007  . TONSILLECTOMY AND ADENOIDECTOMY, HX OF 09/22/2007    Past Surgical History:  Procedure Laterality Date  . BASAL CELL CARCINOMA EXCISION     From the right cheek  . BREAST BIOPSY Right 12/09/13   right upper inner  . BREAST LUMPECTOMY WITH NEEDLE LOCALIZATION Right 01/04/2014   Performed by Excell Seltzer, MD at [facility]    . TONSILLECTOMY     at age 71  . uterine polyp removed  5+yrs ago    OB History    No data available       Home Medications    Prior to Admission medications   Medication Sig Start Date End Date Taking? Authorizing Provider  ALFALFA PO Take 2 tablets by mouth daily.     [provider]  Cholecalciferol (VITAMIN D-3 PO) Take 1,000 Units by mouth daily.     [provider]  fexofenadine (ALLEGRA) 180 MG tablet Take 180 mg by mouth daily.    [provider]  HYDROCORTISONE EX Apply 1 application topically daily as needed (for eczema).    [provider]  metroNIDAZOLE (METROGEL) 1 % gel Apply 1 application topically daily. Applies to facial rosacea.    [provider]  Multiple Vitamins-Minerals (MULTIVITAMIN PO) Take 1 tablet by mouth daily.    [provider]  Multiple Vitamins-Minerals (ZINC PO) Take 1 tablet by mouth daily as needed (for exposure to common cold).    [provider]  Omega-3 Fatty Acids (FISH OIL) 1200 MG CAPS Take 1,200 mg by mouth daily.    [provider]  Psyllium (NAT-RUL PSYLLIUM SEED HUSKS) 500 MG CAPS Take 1,000 mg by mouth 2 (two) times  daily.    [provider]  triamcinolone (NASACORT AQ) 55 MCG/ACT AERO nasal inhaler Place 2 sprays into both nostrils daily.    [provider]    Family History Family History  Problem Relation Age of Onset  . Bladder Cancer Brother   . Bladder Cancer Brother   . Alzheimer's disease Mother   . Heart attack Father   . ALS Brother   . Breast cancer Paternal Aunt 49       lived to be 81  . Colon cancer Neg Hx   . Esophageal cancer Neg Hx   . Stomach cancer Neg Hx   . Rectal cancer Neg Hx     Social History Social History   Tobacco Use  . Smoking status: Current Every Day Smoker    Packs/day: 0.25    Years: 40.00    Pack years: 10.00    Types: Cigarettes  . Smokeless tobacco: Never Used  . Tobacco  comment: hx 1 PPD  Substance Use Topics  . Alcohol use: Yes    Alcohol/week: 0.0 oz    Comment: 1 beer daily  . Drug use: No     Allergies   Patient has no known allergies.   Review of Systems Review of Systems  Constitutional: Negative for chills and fever.  HENT: Positive for congestion, nosebleeds and postnasal drip.   Neurological: Negative for dizziness and light-headedness.     Physical Exam Updated Vital Signs BP 138/81 (BP Location: Left Arm)   Pulse 68   Temp 97.9 F (36.6 C) (Oral)   Resp 16   SpO2 99%   Physical Exam  Constitutional: She appears well-developed and well-nourished. No distress.  HENT:  Head: Normocephalic and atraumatic.  Nose: Epistaxis (From the left nostril) is observed.  Bloody postnasal drainage noted.  Eyes: Conjunctivae and EOM are normal. No scleral icterus.  Neck: Normal range of motion.  Pulmonary/Chest: Effort normal. No respiratory distress.  Neurological: She is alert.  Skin: No rash noted. She is not diaphoretic.  Psychiatric: She has a normal mood and affect.  Nursing note and vitals reviewed.    ED Treatments / Results  Labs (all labs ordered are listed, but only abnormal results are displayed) Labs Reviewed  CBC WITH DIFFERENTIAL/PLATELET    EKG  EKG Interpretation None       Radiology No results found.  Procedures Procedures (including critical care time)  Medications Ordered in ED Medications  silver nitrate applicators applicator 1 Stick (not administered)  oxymetazoline (AFRIN) 0.05 % nasal spray 1 spray (1 spray Each Nare Given 09/29/17 1228)     Initial Impression / Assessment and Plan / ED Course  I have reviewed the triage vital signs and the nursing notes.  Pertinent labs & imaging results that were available during my care of the patient were reviewed by me and considered in my medical decision making (see chart for details).     Patient presents to ED for evaluation of intermittent  epistaxis for the past 4 days.  States that this current episode has lasted more than a few minutes which is different from her prior episodes for the past 4 days.  No previous history of similar symptoms.  She denies any lightheadedness or dizziness.  No blood thinner use, no changes in medication, no bleeding disorder noted.  She does have a history of Nasacort use but has not used in the past 2 days.  On physical exam patient was given Afrin but bleeding resumed after  1 hour.  There is one area of punctuate bleeding noted.  This was controlled with silver nitrate.  No packing at this time warranted.  Hemoglobin and hematocrit are both normal.  Patient given return precautions and precautions for what to do if bleeding resolves.  Patient appears stable for discharge at this time.  Patient discussed with and seen by Dr. Francia Greaves.  Final Clinical Impressions(s) / ED Diagnoses   Final diagnoses:  Epistaxis  Left-sided epistaxis    ED Discharge Orders    None       Delia Heady, PA-C 09/29/17 1458    Valarie Merino, MD 09/29/17 [phone]

## 2017-10-06 DIAGNOSIS — M859 Disorder of bone density and structure, unspecified: Secondary | ICD-10-CM | POA: Diagnosis not present

## 2017-10-06 DIAGNOSIS — E7849 Other hyperlipidemia: Secondary | ICD-10-CM | POA: Diagnosis not present

## 2017-10-06 DIAGNOSIS — R82998 Other abnormal findings in urine: Secondary | ICD-10-CM | POA: Diagnosis not present

## 2017-10-13 DIAGNOSIS — J449 Chronic obstructive pulmonary disease, unspecified: Secondary | ICD-10-CM | POA: Diagnosis not present

## 2017-10-13 DIAGNOSIS — M25561 Pain in right knee: Secondary | ICD-10-CM | POA: Diagnosis not present

## 2017-10-13 DIAGNOSIS — K5730 Diverticulosis of large intestine without perforation or abscess without bleeding: Secondary | ICD-10-CM | POA: Diagnosis not present

## 2017-10-13 DIAGNOSIS — J3089 Other allergic rhinitis: Secondary | ICD-10-CM | POA: Diagnosis not present

## 2017-10-13 DIAGNOSIS — Z6822 Body mass index (BMI) 22.0-22.9, adult: Secondary | ICD-10-CM | POA: Diagnosis not present

## 2017-10-13 DIAGNOSIS — M859 Disorder of bone density and structure, unspecified: Secondary | ICD-10-CM | POA: Diagnosis not present

## 2017-10-13 DIAGNOSIS — Z23 Encounter for immunization: Secondary | ICD-10-CM | POA: Diagnosis not present

## 2017-10-13 DIAGNOSIS — L718 Other rosacea: Secondary | ICD-10-CM | POA: Diagnosis not present

## 2017-10-13 DIAGNOSIS — L308 Other specified dermatitis: Secondary | ICD-10-CM | POA: Diagnosis not present

## 2017-10-13 DIAGNOSIS — Z1389 Encounter for screening for other disorder: Secondary | ICD-10-CM | POA: Diagnosis not present

## 2017-10-13 DIAGNOSIS — E7849 Other hyperlipidemia: Secondary | ICD-10-CM | POA: Diagnosis not present

## 2017-10-13 DIAGNOSIS — Z0000 Encounter for general adult medical examination without abnormal findings: Secondary | ICD-10-CM | POA: Diagnosis not present

## 2017-11-12 DIAGNOSIS — M859 Disorder of bone density and structure, unspecified: Secondary | ICD-10-CM | POA: Diagnosis not present

## 2017-11-17 ENCOUNTER — Other Ambulatory Visit: Payer: Self-pay | Admitting: Obstetrics & Gynecology

## 2017-11-17 DIAGNOSIS — Z9889 Other specified postprocedural states: Secondary | ICD-10-CM

## 2017-12-01 ENCOUNTER — Other Ambulatory Visit: Payer: Self-pay | Admitting: Obstetrics & Gynecology

## 2017-12-01 ENCOUNTER — Ambulatory Visit
Admission: RE | Admit: 2017-12-01 | Discharge: 2017-12-01 | Disposition: A | Discharge status: Home / Self Care | Payer: Medicare Other | Source: Ambulatory Visit | Attending: Obstetrics & Gynecology | Admitting: Obstetrics & Gynecology

## 2017-12-01 DIAGNOSIS — R928 Other abnormal and inconclusive findings on diagnostic imaging of breast: Secondary | ICD-10-CM

## 2017-12-01 DIAGNOSIS — R922 Inconclusive mammogram: Secondary | ICD-10-CM | POA: Diagnosis not present

## 2017-12-01 DIAGNOSIS — N6320 Unspecified lump in the left breast, unspecified quadrant: Secondary | ICD-10-CM

## 2017-12-01 DIAGNOSIS — Z9889 Other specified postprocedural states: Secondary | ICD-10-CM

## 2017-12-04 ENCOUNTER — Other Ambulatory Visit: Payer: Self-pay | Admitting: Obstetrics & Gynecology

## 2017-12-04 ENCOUNTER — Ambulatory Visit
Admission: RE | Admit: 2017-12-04 | Discharge: 2017-12-04 | Disposition: A | Discharge status: Home / Self Care | Payer: Medicare Other | Source: Ambulatory Visit | Attending: Obstetrics & Gynecology | Admitting: Obstetrics & Gynecology

## 2017-12-04 DIAGNOSIS — N6320 Unspecified lump in the left breast, unspecified quadrant: Secondary | ICD-10-CM

## 2017-12-04 DIAGNOSIS — N6322 Unspecified lump in the left breast, upper inner quadrant: Secondary | ICD-10-CM | POA: Diagnosis not present

## 2017-12-04 DIAGNOSIS — N6012 Diffuse cystic mastopathy of left breast: Secondary | ICD-10-CM | POA: Diagnosis not present

## 2017-12-08 ENCOUNTER — Other Ambulatory Visit: Payer: Medicare Other

## 2018-05-13 DIAGNOSIS — R05 Cough: Secondary | ICD-10-CM | POA: Diagnosis not present

## 2018-05-13 DIAGNOSIS — J029 Acute pharyngitis, unspecified: Secondary | ICD-10-CM | POA: Diagnosis not present

## 2018-05-13 DIAGNOSIS — Z6821 Body mass index (BMI) 21.0-21.9, adult: Secondary | ICD-10-CM | POA: Diagnosis not present

## 2018-05-16 ENCOUNTER — Other Ambulatory Visit: Payer: Self-pay

## 2018-05-16 ENCOUNTER — Encounter (HOSPITAL_COMMUNITY): Payer: Self-pay | Admitting: Emergency Medicine

## 2018-05-16 ENCOUNTER — Ambulatory Visit (HOSPITAL_COMMUNITY)
Admission: EM | Admit: 2018-05-16 | Discharge: 2018-05-16 | Disposition: A | Discharge status: Home / Self Care | Payer: Medicare Other | Attending: Physician Assistant | Admitting: Physician Assistant

## 2018-05-16 DIAGNOSIS — J40 Bronchitis, not specified as acute or chronic: Secondary | ICD-10-CM

## 2018-05-16 MED ORDER — DOXYCYCLINE HYCLATE 100 MG PO CAPS
100.0000 mg | ORAL_CAPSULE | Freq: Two times a day (BID) | ORAL | 0 refills | Status: DC
Start: 2018-05-16 — End: 2022-07-08

## 2018-05-16 MED ORDER — IPRATROPIUM BROMIDE 0.06 % NA SOLN: 2.0000 | Freq: Four times a day (QID) | NASAL | 0 refills | Status: DC

## 2018-05-16 MED ORDER — BENZONATATE 100 MG PO CAPS: 100.0000 mg | ORAL_CAPSULE | Freq: Three times a day (TID) | ORAL | 0 refills | Status: DC

## 2018-05-16 NOTE — ED Provider Notes (Signed)
[facility]    CSN: [id number] Arrival date & time: 05/16/18  1701     History   Chief Complaint Chief Complaint  Patient presents with  . appointment 5:09pm  . Abdominal Pain    HPI Catherine [name] is a 67 y.o. female.   67 year old female with history of DCIS, diverticulosis comes in for 10 to 12 day history of URI symptoms.  States she has had nasal congestion, chest congestion, rhinorrhea, productive cough.  Has had sneezing.  Has had chills as well.  Denies fever, night sweats, hemoptysis. Has had pressure around the eyes.  She has also had 5-day history of low abdominal pressure bilaterally that occurs after eating.  She feels like she will get nauseous but states she does not without vomiting.  She has had normal bowel movements, but now more frequent as source more gas. Denies hematochezia, melena. She denies chest pain, shortness of breath, wheezing.  Denies weakness, dizziness, syncope. Urinary frequency without hematuria, dysuria. Denies back pain. States symptoms does not feel like UTI. States she saw a provider 4 days ago and was told it was a virus with negative strep. otc cold medicine without relief. Current every day smoker.      Past Medical History:  Diagnosis Date  . Allergy    takes Allegra daily and Nasacort daily  . Breast cancer ([facility]) 12/09/13   right, upper inner, 2:00 o'clock  . DCIS (ductal carcinoma in situ) 11/2013   right breast  . Diverticulosis   . Eczema    uses topical Hydrocortisone daily as needed  . History of shingles   . Internal hemorrhoids   . Joint pain   . Nocturia   . PONV (postoperative nausea and vomiting)   . Rosacea    uses topical Metrogel daily as needed  . Seasonal allergies   . Urinary frequency     Patient Active Problem List   Diagnosis Date Noted  . DCIS (ductal carcinoma in situ)   . Breast cancer of upper-inner quadrant of right female breast ([facility]) 12/13/2013  . TOBACCO ABUSE 09/22/2007  . OTITIS  MEDIA, CHRONIC 09/22/2007  . TONSILLECTOMY AND ADENOIDECTOMY, HX OF 09/22/2007    Past Surgical History:  Procedure Laterality Date  . BASAL CELL CARCINOMA EXCISION     From the right cheek  . BREAST BIOPSY Right 12/09/13   right upper inner  . BREAST LUMPECTOMY Right 2015  . BREAST LUMPECTOMY WITH NEEDLE LOCALIZATION Right 01/04/2014   Procedure: BREAST LUMPECTOMY WITH NEEDLE LOCALIZATION;  Surgeon: Edward Jolly, MD;  Location: [facility];  Service: General;  Laterality: Right;  . COLONOSCOPY    . TONSILLECTOMY     at age 4  . uterine polyp removed  5+yrs ago    OB History   None      Home Medications    Prior to Admission medications   Medication Sig Start Date End Date Taking? Authorizing Provider  fexofenadine (ALLEGRA) 180 MG tablet Take 180 mg by mouth daily.   Yes [provider]  benzonatate (TESSALON) 100 MG capsule Take 1 capsule (100 mg total) by mouth every 8 (eight) hours. 05/16/18   Tasia Catchings, Brynda Heick V, PA-C  doxycycline (VIBRAMYCIN) 100 MG capsule Take 1 capsule (100 mg total) by mouth 2 (two) times daily. 05/16/18   Tasia Catchings, Francesco Provencal V, PA-C  ipratropium (ATROVENT) 0.06 % nasal spray Place 2 sprays into both nostrils 4 (four) times daily. 05/16/18   Ok Edwards, PA-C  Family History Family History  Problem Relation Age of Onset  . Bladder Cancer Brother   . Bladder Cancer Brother   . Alzheimer's disease Mother   . Heart attack Father   . ALS Brother   . Breast cancer Paternal Aunt 66       lived to be 60  . Colon cancer Neg Hx   . Esophageal cancer Neg Hx   . Stomach cancer Neg Hx   . Rectal cancer Neg Hx     Social History Social History   Tobacco Use  . Smoking status: Current Every Day Smoker    Packs/day: 0.25    Years: 40.00    Pack years: 10.00    Types: Cigarettes  . Smokeless tobacco: Never Used  . Tobacco comment: hx 1 PPD  Substance Use Topics  . Alcohol use: Yes    Comment: 1 beer daily  . Drug use: No     Allergies   Patient has no  known allergies.   Review of Systems Review of Systems  Reason unable to perform ROS: See HPI as above.     Physical Exam Triage Vital Signs ED Triage Vitals [05/16/18 1723]  Enc Vitals Group     BP 128/79     Pulse Rate 93     Resp 18     Temp 98.6 F (37 C)     Temp Source Oral     SpO2 97 %     Weight      Height      Head Circumference      Peak Flow      Pain Score 5     Pain Loc      Pain Edu?      Excl. in [place]?    No data found.  Updated Vital Signs BP 128/79 (BP Location: Left Arm)   Pulse 93   Temp 98.6 F (37 C) (Oral)   Resp 18   SpO2 97%   Physical Exam  Constitutional: She is oriented to person, place, and time. She appears well-developed and well-nourished.  Non-toxic appearance. She does not appear ill. No distress.  HENT:  Head: Normocephalic and atraumatic.  Right Ear: Tympanic membrane, external ear and ear canal normal. Tympanic membrane is not erythematous and not bulging.  Left Ear: Tympanic membrane, external ear and ear canal normal. Tympanic membrane is not erythematous and not bulging.  Nose: Nose normal. Right sinus exhibits no maxillary sinus tenderness and no frontal sinus tenderness. Left sinus exhibits no maxillary sinus tenderness and no frontal sinus tenderness.  Mouth/Throat: Uvula is midline, oropharynx is clear and moist and mucous membranes are normal.  Eyes: Pupils are equal, round, and reactive to light. Conjunctivae are normal.  Neck: Normal range of motion. Neck supple.  Cardiovascular: Normal rate, regular rhythm and normal heart sounds. Exam reveals no gallop and no friction rub.  No murmur heard. Pulmonary/Chest: Effort normal and breath sounds normal. No stridor. No respiratory distress. She has no decreased breath sounds. She has no wheezes. She has no rhonchi. She has no rales. She exhibits no tenderness.  Abdominal: Soft. Bowel sounds are normal. There is no tenderness. There is no rigidity, no rebound, no guarding and  no CVA tenderness.  Lymphadenopathy:    She has no cervical adenopathy.  Neurological: She is alert and oriented to person, place, and time.  Skin: Skin is warm and dry.  Psychiatric: She has a normal mood and affect. Her behavior is normal. Judgment  normal.     UC Treatments / Results  Labs (all labs ordered are listed, but only abnormal results are displayed) Labs Reviewed - No data to display  EKG None  Radiology No results found.  Procedures Procedures (including critical care time)  Medications Ordered in UC Medications - No data to display  Initial Impression / Assessment and Plan / UC Course  I have reviewed the triage vital signs and the nursing notes.  Pertinent labs & imaging results that were available during my care of the patient were reviewed by me and considered in my medical decision making (see chart for details).    Will start patient on doxycycline to cover for bronchitis/sinusitis.  Other symptomatic treatment discussed.  Patient with nontender abdomen, no guarding, no rebound, without blood in stool, lower suspicion for diverticulitis right now.  Strict return precautions given.  Patient expresses understanding and agrees to plan.  Final Clinical Impressions(s) / UC Diagnoses   Final diagnoses:  Bronchitis    ED Prescriptions    Medication Sig Dispense Auth. Provider   doxycycline (VIBRAMYCIN) 100 MG capsule Take 1 capsule (100 mg total) by mouth 2 (two) times daily. 20 capsule Yonah Tangeman V, PA-C   ipratropium (ATROVENT) 0.06 % nasal spray Place 2 sprays into both nostrils 4 (four) times daily. 15 mL Dorrian Doggett V, PA-C   benzonatate (TESSALON) 100 MG capsule Take 1 capsule (100 mg total) by mouth every 8 (eight) hours. 21 capsule Tobin Chad, [place] 05/16/18 [phone]

## 2018-05-16 NOTE — ED Triage Notes (Signed)
The patient presented to the [facility] with a complaint of abdominal pain and general body aches x 1 week.

## 2018-05-16 NOTE — Discharge Instructions (Signed)
Start doxycycline as directed, this will cover for bronchitis and sinus infection. Tessalon for cough. Start atrovent nasal spray for nasal congestion/drainage. You can use over the counter nasal saline rinse such as neti pot for nasal congestion. Keep hydrated, your urine should be clear to pale yellow in color. Tylenol/motrin for fever and pain. Monitor for any worsening of symptoms, chest pain, shortness of breath, wheezing, swelling of the throat, follow up for reevaluation.   For sore throat/cough try using a honey-based tea. Use 3 teaspoons of honey with juice squeezed from half lemon. Place shaved pieces of ginger into 1/2-1 cup of water and warm over stove top. Then mix the ingredients and repeat every 4 hours as needed.

## 2018-09-28 DIAGNOSIS — H35361 Drusen (degenerative) of macula, right eye: Secondary | ICD-10-CM | POA: Diagnosis not present

## 2018-09-28 DIAGNOSIS — H25013 Cortical age-related cataract, bilateral: Secondary | ICD-10-CM | POA: Diagnosis not present

## 2018-09-28 DIAGNOSIS — H2513 Age-related nuclear cataract, bilateral: Secondary | ICD-10-CM | POA: Diagnosis not present

## 2018-09-28 DIAGNOSIS — H43393 Other vitreous opacities, bilateral: Secondary | ICD-10-CM | POA: Diagnosis not present

## 2018-10-01 DIAGNOSIS — L821 Other seborrheic keratosis: Secondary | ICD-10-CM | POA: Diagnosis not present

## 2018-10-01 DIAGNOSIS — B078 Other viral warts: Secondary | ICD-10-CM | POA: Diagnosis not present

## 2018-10-01 DIAGNOSIS — Z419 Encounter for procedure for purposes other than remedying health state, unspecified: Secondary | ICD-10-CM | POA: Diagnosis not present

## 2018-10-01 DIAGNOSIS — Z85828 Personal history of other malignant neoplasm of skin: Secondary | ICD-10-CM | POA: Diagnosis not present

## 2018-10-01 DIAGNOSIS — L720 Epidermal cyst: Secondary | ICD-10-CM | POA: Diagnosis not present

## 2018-10-01 DIAGNOSIS — L814 Other melanin hyperpigmentation: Secondary | ICD-10-CM | POA: Diagnosis not present

## 2018-10-01 DIAGNOSIS — L570 Actinic keratosis: Secondary | ICD-10-CM | POA: Diagnosis not present

## 2018-11-13 ENCOUNTER — Other Ambulatory Visit: Payer: Self-pay | Admitting: Obstetrics & Gynecology

## 2018-11-13 DIAGNOSIS — R921 Mammographic calcification found on diagnostic imaging of breast: Secondary | ICD-10-CM

## 2018-11-24 DIAGNOSIS — R82998 Other abnormal findings in urine: Secondary | ICD-10-CM | POA: Diagnosis not present

## 2018-11-24 DIAGNOSIS — M859 Disorder of bone density and structure, unspecified: Secondary | ICD-10-CM | POA: Diagnosis not present

## 2018-11-24 DIAGNOSIS — E7849 Other hyperlipidemia: Secondary | ICD-10-CM | POA: Diagnosis not present

## 2018-12-01 ENCOUNTER — Other Ambulatory Visit: Payer: Self-pay

## 2018-12-01 ENCOUNTER — Other Ambulatory Visit: Payer: Self-pay | Admitting: Obstetrics & Gynecology

## 2018-12-01 DIAGNOSIS — R3121 Asymptomatic microscopic hematuria: Secondary | ICD-10-CM | POA: Diagnosis not present

## 2018-12-01 DIAGNOSIS — J449 Chronic obstructive pulmonary disease, unspecified: Secondary | ICD-10-CM | POA: Diagnosis not present

## 2018-12-01 DIAGNOSIS — J3089 Other allergic rhinitis: Secondary | ICD-10-CM | POA: Diagnosis not present

## 2018-12-01 DIAGNOSIS — Z23 Encounter for immunization: Secondary | ICD-10-CM | POA: Diagnosis not present

## 2018-12-01 DIAGNOSIS — L308 Other specified dermatitis: Secondary | ICD-10-CM | POA: Diagnosis not present

## 2018-12-01 DIAGNOSIS — Z0000 Encounter for general adult medical examination without abnormal findings: Secondary | ICD-10-CM | POA: Diagnosis not present

## 2018-12-01 DIAGNOSIS — Z6821 Body mass index (BMI) 21.0-21.9, adult: Secondary | ICD-10-CM | POA: Diagnosis not present

## 2018-12-01 DIAGNOSIS — E7849 Other hyperlipidemia: Secondary | ICD-10-CM | POA: Diagnosis not present

## 2018-12-01 DIAGNOSIS — B029 Zoster without complications: Secondary | ICD-10-CM | POA: Diagnosis not present

## 2018-12-01 DIAGNOSIS — K5730 Diverticulosis of large intestine without perforation or abscess without bleeding: Secondary | ICD-10-CM | POA: Diagnosis not present

## 2018-12-01 DIAGNOSIS — R921 Mammographic calcification found on diagnostic imaging of breast: Secondary | ICD-10-CM

## 2018-12-01 DIAGNOSIS — R040 Epistaxis: Secondary | ICD-10-CM | POA: Diagnosis not present

## 2018-12-01 DIAGNOSIS — Z1389 Encounter for screening for other disorder: Secondary | ICD-10-CM | POA: Diagnosis not present

## 2018-12-01 DIAGNOSIS — M859 Disorder of bone density and structure, unspecified: Secondary | ICD-10-CM | POA: Diagnosis not present

## 2018-12-03 ENCOUNTER — Ambulatory Visit
Admission: RE | Admit: 2018-12-03 | Discharge: 2018-12-03 | Disposition: A | Discharge status: Home / Self Care | Payer: Medicare Other | Source: Ambulatory Visit | Attending: Obstetrics & Gynecology | Admitting: Obstetrics & Gynecology

## 2018-12-03 DIAGNOSIS — R922 Inconclusive mammogram: Secondary | ICD-10-CM | POA: Diagnosis not present

## 2018-12-03 DIAGNOSIS — Z853 Personal history of malignant neoplasm of breast: Secondary | ICD-10-CM | POA: Diagnosis not present

## 2018-12-03 DIAGNOSIS — R921 Mammographic calcification found on diagnostic imaging of breast: Secondary | ICD-10-CM

## 2019-08-14 DIAGNOSIS — Z23 Encounter for immunization: Secondary | ICD-10-CM | POA: Diagnosis not present

## 2019-08-18 DIAGNOSIS — Z85828 Personal history of other malignant neoplasm of skin: Secondary | ICD-10-CM | POA: Diagnosis not present

## 2019-08-18 DIAGNOSIS — L218 Other seborrheic dermatitis: Secondary | ICD-10-CM | POA: Diagnosis not present

## 2019-11-16 ENCOUNTER — Other Ambulatory Visit: Payer: Self-pay | Admitting: Obstetrics & Gynecology

## 2019-11-16 DIAGNOSIS — Z1231 Encounter for screening mammogram for malignant neoplasm of breast: Secondary | ICD-10-CM

## 2019-11-28 IMAGING — MG DIGITAL DIAGNOSTIC BILATERAL MAMMOGRAM WITH TOMO AND CAD
9 series · 9 of 25 positions shown · non-contrast
Comparison: Previous exam(s).

CLINICAL DATA: 67-year-old female with history of right breast
cancer post lumpectomy 0839. Benign biopsy of a left breast mass
12/04/2017 with pathology revealing ectatic ducts in fibrocystic
changes.

EXAM:
DIGITAL DIAGNOSTIC BILATERAL MAMMOGRAM WITH CAD AND TOMO

[R CC]
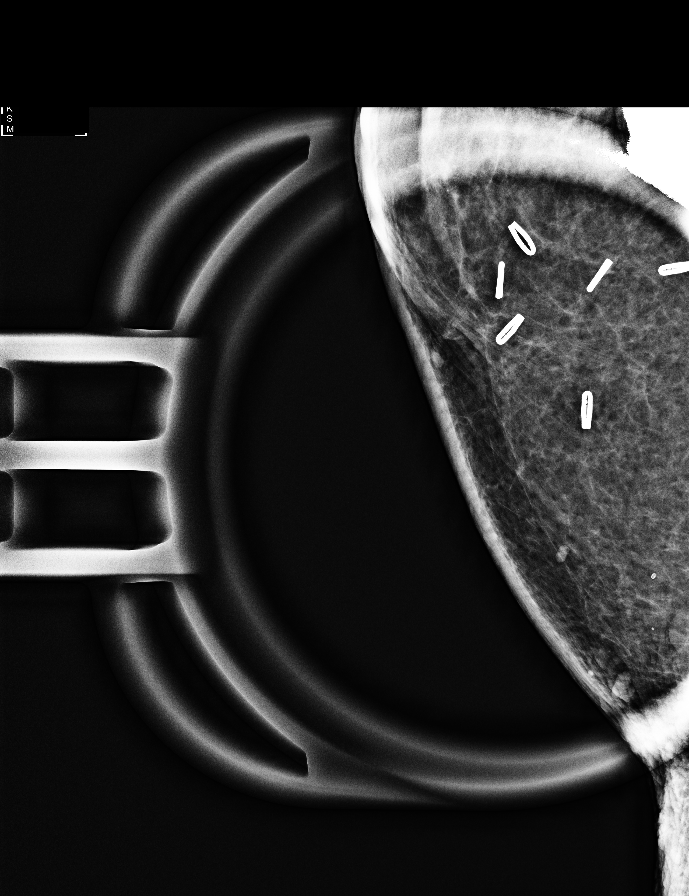

[R MLO synth-2D]
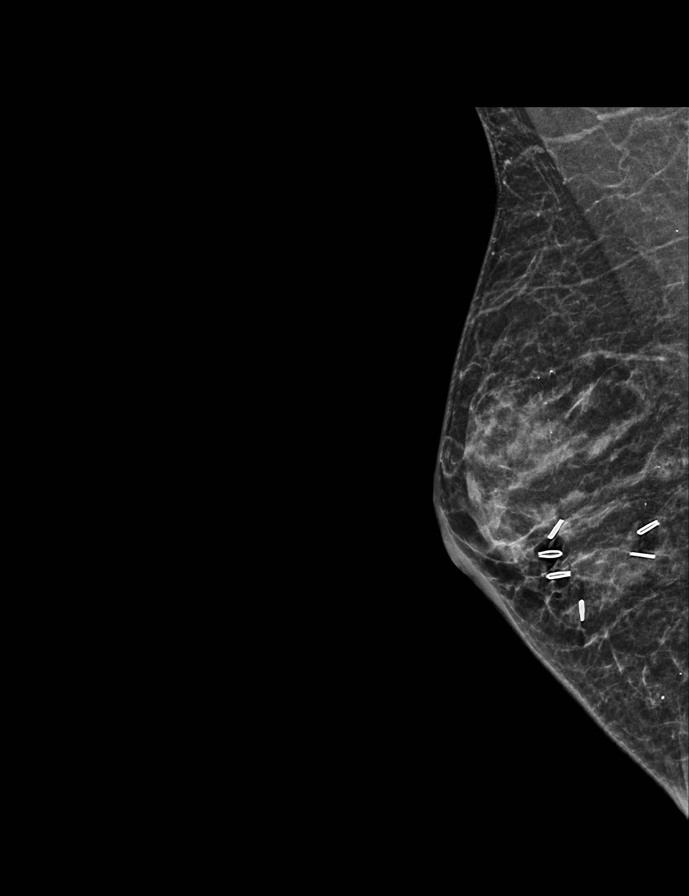

[L CC synth-2D]
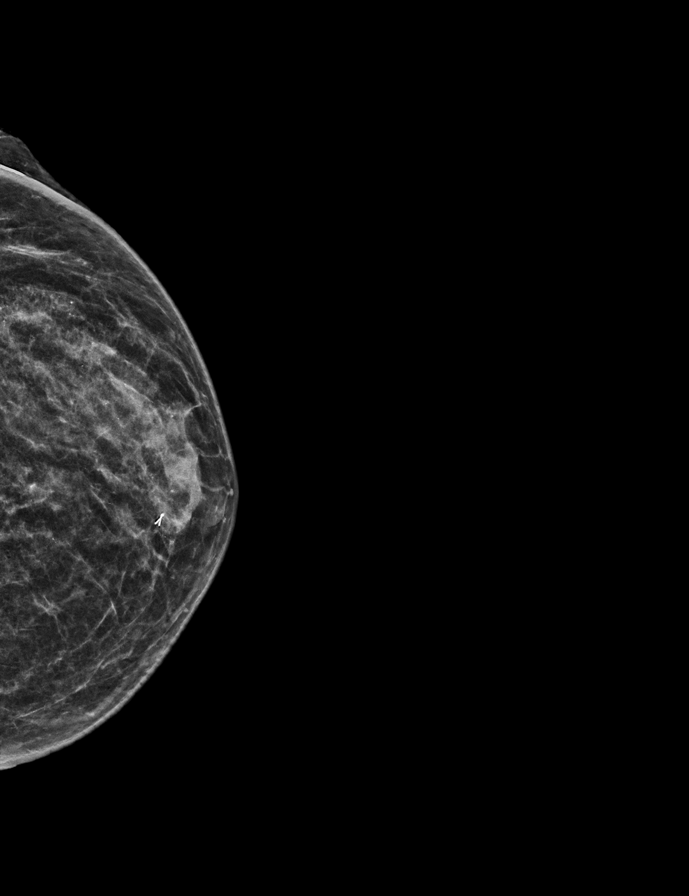

[L MLO synth-2D]
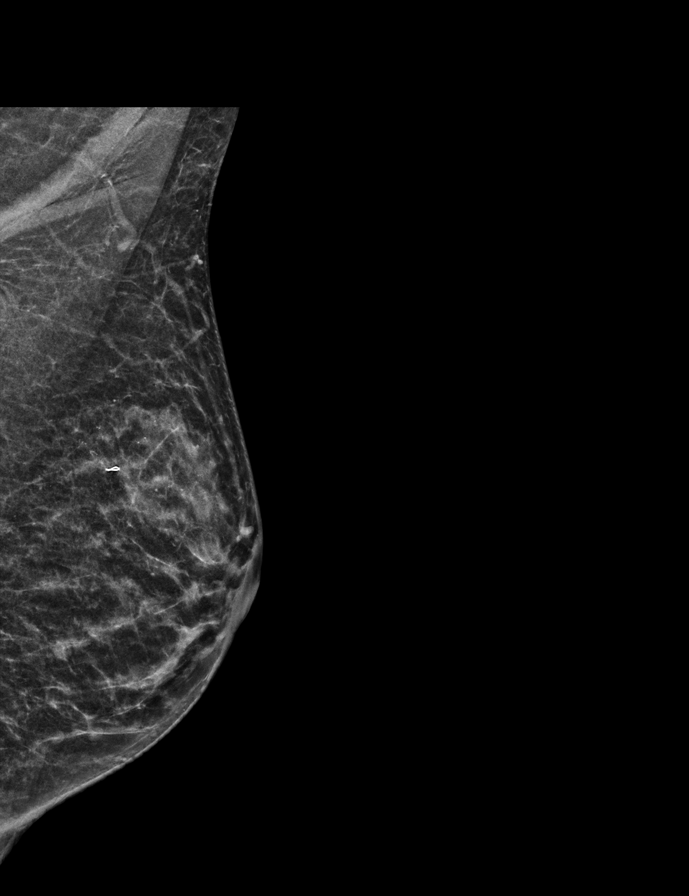

[R CC synth-2D]
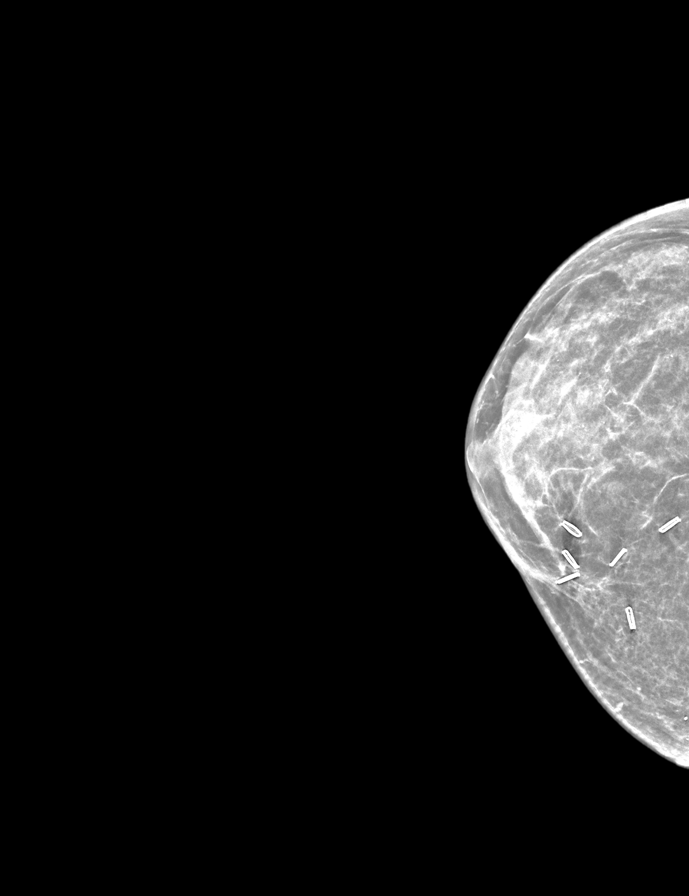

[R CC tomo · tomo slice 19/38.0]
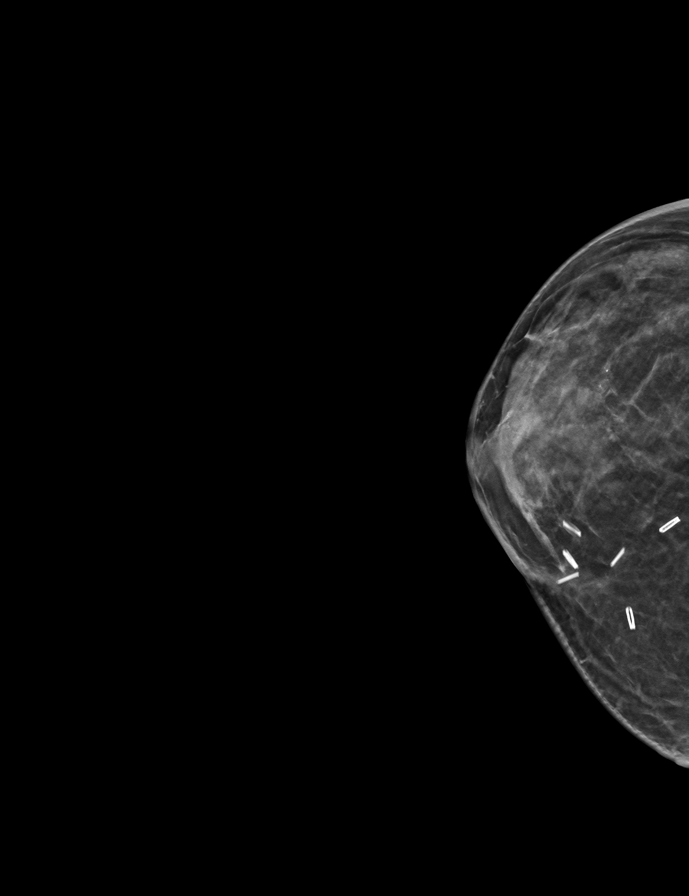

[L CC tomo · tomo slice 22/43.0]
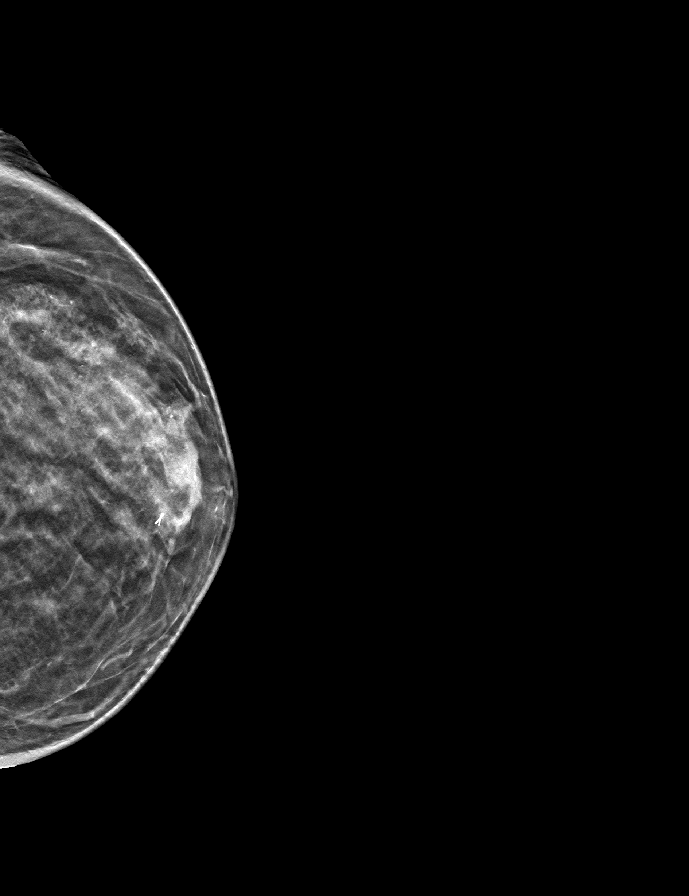

[R MLO tomo · tomo slice 20/39.0]
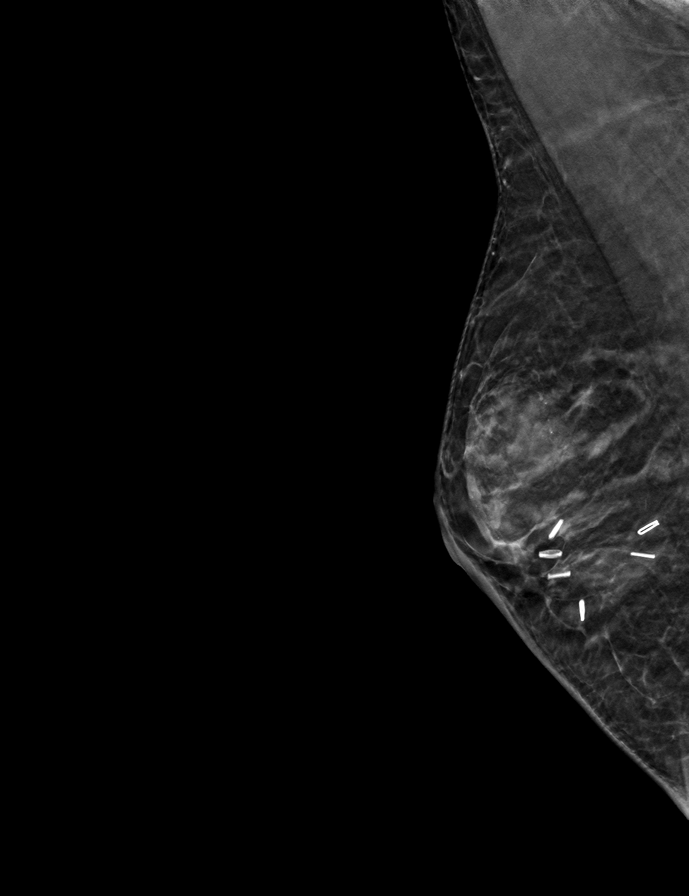

[L MLO tomo · tomo slice 21/42.0]
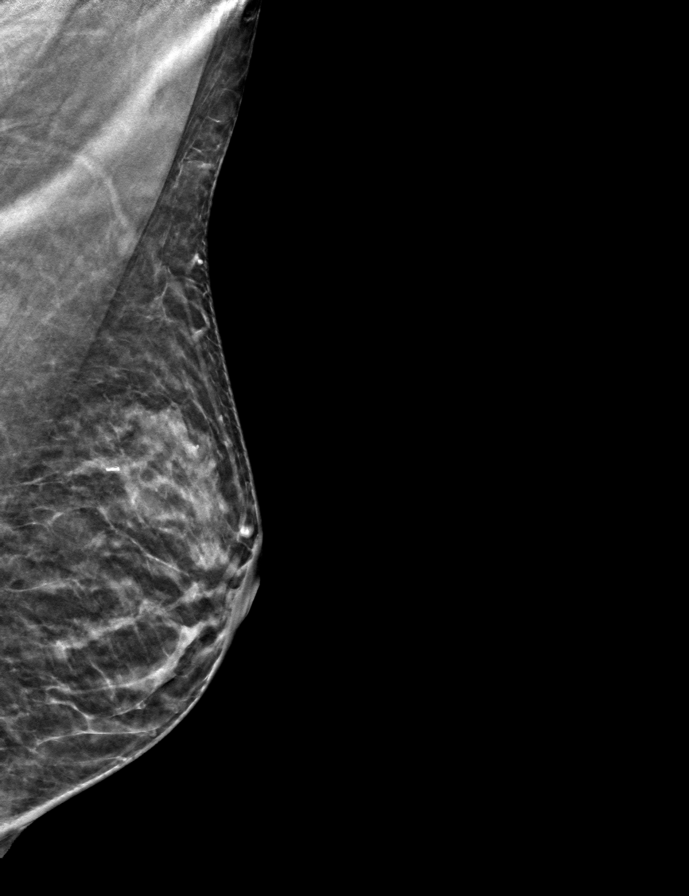

[9 of 25 positions shown; findings below may reference images not displayed]

ACR Breast Density Category c: The breast tissue is heterogeneously
dense, which may obscure small masses.
FINDINGS: No suspicious masses or calcifications are seen in either breast.
Lumpectomy changes again identified in the inner right breast.
Ribbon shaped biopsy marking clip at site of prior benign left
breast biopsy. A spot compression magnification CC view of the right
breast lumpectomy site was performed. There is no mammographic
evidence of locally recurrent malignancy.

Mammographic images were processed with CAD.
IMPRESSION: No mammographic evidence of malignancy in either breast.

RECOMMENDATION:
Screening mammogram in one year.(Code:[id number])

I have discussed the findings and recommendations with the patient.
Results were also provided in writing at the conclusion of the
visit. If applicable, a reminder letter will be sent to the patient
regarding the next appointment.

BI-RADS CATEGORY  2: Benign.

## 2019-12-22 DIAGNOSIS — R82998 Other abnormal findings in urine: Secondary | ICD-10-CM | POA: Diagnosis not present

## 2019-12-22 DIAGNOSIS — M8589 Other specified disorders of bone density and structure, multiple sites: Secondary | ICD-10-CM | POA: Diagnosis not present

## 2019-12-22 DIAGNOSIS — M859 Disorder of bone density and structure, unspecified: Secondary | ICD-10-CM | POA: Diagnosis not present

## 2019-12-22 DIAGNOSIS — E7849 Other hyperlipidemia: Secondary | ICD-10-CM | POA: Diagnosis not present

## 2019-12-22 DIAGNOSIS — M8580 Other specified disorders of bone density and structure, unspecified site: Secondary | ICD-10-CM | POA: Diagnosis not present

## 2019-12-28 ENCOUNTER — Other Ambulatory Visit: Payer: Self-pay

## 2019-12-28 ENCOUNTER — Ambulatory Visit
Admission: RE | Admit: 2019-12-28 | Discharge: 2019-12-28 | Disposition: A | Discharge status: Home / Self Care | Payer: Medicare Other | Source: Ambulatory Visit | Attending: Obstetrics & Gynecology | Admitting: Obstetrics & Gynecology

## 2019-12-28 DIAGNOSIS — Z1231 Encounter for screening mammogram for malignant neoplasm of breast: Secondary | ICD-10-CM | POA: Diagnosis not present

## 2019-12-31 DIAGNOSIS — M8580 Other specified disorders of bone density and structure, unspecified site: Secondary | ICD-10-CM | POA: Diagnosis not present

## 2019-12-31 DIAGNOSIS — K5730 Diverticulosis of large intestine without perforation or abscess without bleeding: Secondary | ICD-10-CM | POA: Diagnosis not present

## 2019-12-31 DIAGNOSIS — B029 Zoster without complications: Secondary | ICD-10-CM | POA: Diagnosis not present

## 2019-12-31 DIAGNOSIS — L719 Rosacea, unspecified: Secondary | ICD-10-CM | POA: Diagnosis not present

## 2019-12-31 DIAGNOSIS — J449 Chronic obstructive pulmonary disease, unspecified: Secondary | ICD-10-CM | POA: Diagnosis not present

## 2019-12-31 DIAGNOSIS — E785 Hyperlipidemia, unspecified: Secondary | ICD-10-CM | POA: Diagnosis not present

## 2019-12-31 DIAGNOSIS — J309 Allergic rhinitis, unspecified: Secondary | ICD-10-CM | POA: Diagnosis not present

## 2019-12-31 DIAGNOSIS — Z1331 Encounter for screening for depression: Secondary | ICD-10-CM | POA: Diagnosis not present

## 2019-12-31 DIAGNOSIS — Z0000 Encounter for general adult medical examination without abnormal findings: Secondary | ICD-10-CM | POA: Diagnosis not present

## 2019-12-31 DIAGNOSIS — F17210 Nicotine dependence, cigarettes, uncomplicated: Secondary | ICD-10-CM | POA: Diagnosis not present

## 2020-01-07 ENCOUNTER — Ambulatory Visit: Payer: Medicare Other | Attending: Internal Medicine

## 2020-01-07 ENCOUNTER — Other Ambulatory Visit: Payer: Self-pay | Admitting: Internal Medicine

## 2020-01-07 DIAGNOSIS — E785 Hyperlipidemia, unspecified: Secondary | ICD-10-CM

## 2020-01-07 DIAGNOSIS — Z23 Encounter for immunization: Secondary | ICD-10-CM | POA: Insufficient documentation

## 2020-01-07 NOTE — Progress Notes (Signed)
   Covid-19 Vaccination Clinic  Name:  Catherine [name]    MRN: [id number] DOB: [birth year]  01/07/2020  [name] was observed post Covid-19 immunization for 15 minutes without incidence. She was provided with Vaccine Information Sheet and instruction to access the V-Safe system.   [name] was instructed to call 911 with any severe reactions post vaccine: Marland Kitchen Difficulty breathing  . Swelling of your face and throat  . A fast heartbeat  . A bad rash all over your body  . Dizziness and weakness    Immunizations Administered    Name Date Dose VIS Date Route   Pfizer COVID-19 Vaccine 01/07/2020  2:27 AM 0.3 mL 10/22/2019 Intramuscular   Manufacturer: [place]   Lot: [id number]   [place]: [id number]

## 2020-02-02 ENCOUNTER — Ambulatory Visit: Payer: Medicare Other | Attending: Internal Medicine

## 2020-02-02 DIAGNOSIS — Z23 Encounter for immunization: Secondary | ICD-10-CM

## 2020-02-02 NOTE — Progress Notes (Signed)
   Covid-19 Vaccination Clinic  Name:  Jovanda Crill    MRN: [id number] DOB: 1950-11-26  02/02/2020  Ms. Hasselman was observed post Covid-19 immunization for 15 minutes without incident. She was provided with Vaccine Information Sheet and instruction to access the V-Safe system.   Ms. Mastandrea was instructed to call 911 with any severe reactions post vaccine: Marland Kitchen Difficulty breathing  . Swelling of face and throat  . A fast heartbeat  . A bad rash all over body  . Dizziness and weakness   Immunizations Administered    Name Date Dose VIS Date Route   Pfizer COVID-19 Vaccine 02/02/2020  9:16 AM 0.3 mL 10/22/2019 Intramuscular   Manufacturer: [place]   Lot: [id number]   [place]: [id number]

## 2020-07-03 DIAGNOSIS — M25512 Pain in left shoulder: Secondary | ICD-10-CM | POA: Diagnosis not present

## 2020-07-03 DIAGNOSIS — M25511 Pain in right shoulder: Secondary | ICD-10-CM | POA: Diagnosis not present

## 2020-07-03 DIAGNOSIS — M7501 Adhesive capsulitis of right shoulder: Secondary | ICD-10-CM | POA: Diagnosis not present

## 2020-07-18 DIAGNOSIS — M25511 Pain in right shoulder: Secondary | ICD-10-CM | POA: Diagnosis not present

## 2020-07-18 DIAGNOSIS — M25512 Pain in left shoulder: Secondary | ICD-10-CM | POA: Diagnosis not present

## 2020-07-28 DIAGNOSIS — M25511 Pain in right shoulder: Secondary | ICD-10-CM | POA: Diagnosis not present

## 2020-07-28 DIAGNOSIS — M25512 Pain in left shoulder: Secondary | ICD-10-CM | POA: Diagnosis not present

## 2020-08-04 DIAGNOSIS — M25511 Pain in right shoulder: Secondary | ICD-10-CM | POA: Diagnosis not present

## 2020-08-04 DIAGNOSIS — M25512 Pain in left shoulder: Secondary | ICD-10-CM | POA: Diagnosis not present

## 2020-08-11 DIAGNOSIS — M25512 Pain in left shoulder: Secondary | ICD-10-CM | POA: Diagnosis not present

## 2020-08-11 DIAGNOSIS — M25511 Pain in right shoulder: Secondary | ICD-10-CM | POA: Diagnosis not present

## 2020-08-14 DIAGNOSIS — M7501 Adhesive capsulitis of right shoulder: Secondary | ICD-10-CM | POA: Diagnosis not present

## 2020-08-14 DIAGNOSIS — M25512 Pain in left shoulder: Secondary | ICD-10-CM | POA: Diagnosis not present

## 2020-08-14 DIAGNOSIS — M25511 Pain in right shoulder: Secondary | ICD-10-CM | POA: Diagnosis not present

## 2020-08-18 DIAGNOSIS — M25511 Pain in right shoulder: Secondary | ICD-10-CM | POA: Diagnosis not present

## 2020-08-18 DIAGNOSIS — M25512 Pain in left shoulder: Secondary | ICD-10-CM | POA: Diagnosis not present

## 2020-09-06 DIAGNOSIS — Z23 Encounter for immunization: Secondary | ICD-10-CM | POA: Diagnosis not present

## 2020-09-25 DIAGNOSIS — M7540 Impingement syndrome of unspecified shoulder: Secondary | ICD-10-CM | POA: Diagnosis not present

## 2020-09-25 DIAGNOSIS — M25511 Pain in right shoulder: Secondary | ICD-10-CM | POA: Diagnosis not present

## 2020-10-14 DIAGNOSIS — Z23 Encounter for immunization: Secondary | ICD-10-CM | POA: Diagnosis not present

## 2020-11-15 DIAGNOSIS — D485 Neoplasm of uncertain behavior of skin: Secondary | ICD-10-CM | POA: Diagnosis not present

## 2020-11-15 DIAGNOSIS — L720 Epidermal cyst: Secondary | ICD-10-CM | POA: Diagnosis not present

## 2020-11-15 DIAGNOSIS — L820 Inflamed seborrheic keratosis: Secondary | ICD-10-CM | POA: Diagnosis not present

## 2020-11-15 DIAGNOSIS — D1801 Hemangioma of skin and subcutaneous tissue: Secondary | ICD-10-CM | POA: Diagnosis not present

## 2020-11-15 DIAGNOSIS — L570 Actinic keratosis: Secondary | ICD-10-CM | POA: Diagnosis not present

## 2020-11-15 DIAGNOSIS — L718 Other rosacea: Secondary | ICD-10-CM | POA: Diagnosis not present

## 2020-11-15 DIAGNOSIS — Z85828 Personal history of other malignant neoplasm of skin: Secondary | ICD-10-CM | POA: Diagnosis not present

## 2020-11-15 DIAGNOSIS — B078 Other viral warts: Secondary | ICD-10-CM | POA: Diagnosis not present

## 2020-11-15 DIAGNOSIS — D2262 Melanocytic nevi of left upper limb, including shoulder: Secondary | ICD-10-CM | POA: Diagnosis not present

## 2020-11-15 DIAGNOSIS — L821 Other seborrheic keratosis: Secondary | ICD-10-CM | POA: Diagnosis not present

## 2020-12-13 ENCOUNTER — Other Ambulatory Visit: Payer: Self-pay | Admitting: Obstetrics & Gynecology

## 2020-12-13 DIAGNOSIS — Z1231 Encounter for screening mammogram for malignant neoplasm of breast: Secondary | ICD-10-CM

## 2020-12-29 DIAGNOSIS — M859 Disorder of bone density and structure, unspecified: Secondary | ICD-10-CM | POA: Diagnosis not present

## 2020-12-29 DIAGNOSIS — R82998 Other abnormal findings in urine: Secondary | ICD-10-CM | POA: Diagnosis not present

## 2020-12-29 DIAGNOSIS — E785 Hyperlipidemia, unspecified: Secondary | ICD-10-CM | POA: Diagnosis not present

## 2021-01-03 DIAGNOSIS — Z0000 Encounter for general adult medical examination without abnormal findings: Secondary | ICD-10-CM | POA: Diagnosis not present

## 2021-01-03 DIAGNOSIS — M8589 Other specified disorders of bone density and structure, multiple sites: Secondary | ICD-10-CM | POA: Diagnosis not present

## 2021-01-03 DIAGNOSIS — F17210 Nicotine dependence, cigarettes, uncomplicated: Secondary | ICD-10-CM | POA: Diagnosis not present

## 2021-01-03 DIAGNOSIS — R3121 Asymptomatic microscopic hematuria: Secondary | ICD-10-CM | POA: Diagnosis not present

## 2021-01-03 DIAGNOSIS — E785 Hyperlipidemia, unspecified: Secondary | ICD-10-CM | POA: Diagnosis not present

## 2021-01-03 DIAGNOSIS — L719 Rosacea, unspecified: Secondary | ICD-10-CM | POA: Diagnosis not present

## 2021-01-03 DIAGNOSIS — J449 Chronic obstructive pulmonary disease, unspecified: Secondary | ICD-10-CM | POA: Diagnosis not present

## 2021-01-05 ENCOUNTER — Other Ambulatory Visit: Payer: Self-pay | Admitting: Internal Medicine

## 2021-01-05 DIAGNOSIS — E785 Hyperlipidemia, unspecified: Secondary | ICD-10-CM

## 2021-01-24 ENCOUNTER — Ambulatory Visit
Admission: RE | Admit: 2021-01-24 | Discharge: 2021-01-24 | Disposition: A | Discharge status: Home / Self Care | Payer: Medicare Other | Source: Ambulatory Visit | Attending: Internal Medicine | Admitting: Internal Medicine

## 2021-01-24 DIAGNOSIS — E785 Hyperlipidemia, unspecified: Secondary | ICD-10-CM

## 2021-01-29 ENCOUNTER — Ambulatory Visit
Admission: RE | Admit: 2021-01-29 | Discharge: 2021-01-29 | Disposition: A | Discharge status: Home / Self Care | Payer: Medicare Other | Source: Ambulatory Visit | Attending: Obstetrics & Gynecology | Admitting: Obstetrics & Gynecology

## 2021-01-29 ENCOUNTER — Other Ambulatory Visit: Payer: Self-pay

## 2021-01-29 DIAGNOSIS — Z1231 Encounter for screening mammogram for malignant neoplasm of breast: Secondary | ICD-10-CM | POA: Diagnosis not present

## 2021-02-02 ENCOUNTER — Other Ambulatory Visit: Payer: Self-pay | Admitting: Internal Medicine

## 2021-02-02 DIAGNOSIS — R911 Solitary pulmonary nodule: Secondary | ICD-10-CM

## 2021-07-17 ENCOUNTER — Ambulatory Visit
Admission: RE | Admit: 2021-07-17 | Discharge: 2021-07-17 | Disposition: A | Discharge status: Home / Self Care | Payer: Medicare Other | Source: Ambulatory Visit | Attending: Internal Medicine | Admitting: Internal Medicine

## 2021-07-17 DIAGNOSIS — R911 Solitary pulmonary nodule: Secondary | ICD-10-CM

## 2021-07-17 DIAGNOSIS — I700 Atherosclerosis of aorta: Secondary | ICD-10-CM | POA: Diagnosis not present

## 2021-07-17 DIAGNOSIS — R918 Other nonspecific abnormal finding of lung field: Secondary | ICD-10-CM | POA: Diagnosis not present

## 2021-07-17 DIAGNOSIS — I712 Thoracic aortic aneurysm, without rupture: Secondary | ICD-10-CM | POA: Diagnosis not present

## 2021-09-13 DIAGNOSIS — Z23 Encounter for immunization: Secondary | ICD-10-CM | POA: Diagnosis not present

## 2021-11-22 DIAGNOSIS — L570 Actinic keratosis: Secondary | ICD-10-CM | POA: Diagnosis not present

## 2021-11-22 DIAGNOSIS — L821 Other seborrheic keratosis: Secondary | ICD-10-CM | POA: Diagnosis not present

## 2021-11-22 DIAGNOSIS — L718 Other rosacea: Secondary | ICD-10-CM | POA: Diagnosis not present

## 2021-11-22 DIAGNOSIS — L820 Inflamed seborrheic keratosis: Secondary | ICD-10-CM | POA: Diagnosis not present

## 2021-11-22 DIAGNOSIS — Z85828 Personal history of other malignant neoplasm of skin: Secondary | ICD-10-CM | POA: Diagnosis not present

## 2021-11-22 DIAGNOSIS — L720 Epidermal cyst: Secondary | ICD-10-CM | POA: Diagnosis not present

## 2021-11-28 DIAGNOSIS — L720 Epidermal cyst: Secondary | ICD-10-CM | POA: Diagnosis not present

## 2021-11-28 DIAGNOSIS — Z85828 Personal history of other malignant neoplasm of skin: Secondary | ICD-10-CM | POA: Diagnosis not present

## 2021-11-28 DIAGNOSIS — D2372 Other benign neoplasm of skin of left lower limb, including hip: Secondary | ICD-10-CM | POA: Diagnosis not present

## 2021-11-28 DIAGNOSIS — L821 Other seborrheic keratosis: Secondary | ICD-10-CM | POA: Diagnosis not present

## 2021-11-28 DIAGNOSIS — L308 Other specified dermatitis: Secondary | ICD-10-CM | POA: Diagnosis not present

## 2021-11-28 DIAGNOSIS — L918 Other hypertrophic disorders of the skin: Secondary | ICD-10-CM | POA: Diagnosis not present

## 2021-11-28 DIAGNOSIS — D1801 Hemangioma of skin and subcutaneous tissue: Secondary | ICD-10-CM | POA: Diagnosis not present

## 2021-11-28 DIAGNOSIS — L718 Other rosacea: Secondary | ICD-10-CM | POA: Diagnosis not present

## 2021-12-28 DIAGNOSIS — R3121 Asymptomatic microscopic hematuria: Secondary | ICD-10-CM | POA: Diagnosis not present

## 2022-01-08 ENCOUNTER — Other Ambulatory Visit: Payer: Self-pay | Admitting: Internal Medicine

## 2022-01-08 DIAGNOSIS — Z1231 Encounter for screening mammogram for malignant neoplasm of breast: Secondary | ICD-10-CM

## 2022-01-21 ENCOUNTER — Emergency Department (HOSPITAL_BASED_OUTPATIENT_CLINIC_OR_DEPARTMENT_OTHER): Payer: Medicare Other

## 2022-01-21 ENCOUNTER — Other Ambulatory Visit: Payer: Self-pay

## 2022-01-21 ENCOUNTER — Emergency Department (HOSPITAL_BASED_OUTPATIENT_CLINIC_OR_DEPARTMENT_OTHER)
Admission: EM | Admit: 2022-01-21 | Discharge: 2022-01-21 | Disposition: A | Discharge status: Home / Self Care | Payer: Medicare Other | Attending: Emergency Medicine | Admitting: Emergency Medicine

## 2022-01-21 DIAGNOSIS — W1812XA Fall from or off toilet with subsequent striking against object, initial encounter: Secondary | ICD-10-CM | POA: Diagnosis not present

## 2022-01-21 DIAGNOSIS — K922 Gastrointestinal hemorrhage, unspecified: Secondary | ICD-10-CM | POA: Insufficient documentation

## 2022-01-21 DIAGNOSIS — S0083XA Contusion of other part of head, initial encounter: Secondary | ICD-10-CM | POA: Diagnosis not present

## 2022-01-21 DIAGNOSIS — R001 Bradycardia, unspecified: Secondary | ICD-10-CM | POA: Diagnosis not present

## 2022-01-21 DIAGNOSIS — S0990XA Unspecified injury of head, initial encounter: Secondary | ICD-10-CM | POA: Diagnosis present

## 2022-01-21 LAB — COMPREHENSIVE METABOLIC PANEL
ALT: 16 U/L (ref 0–44)
AST: 16 U/L (ref 15–41)
Albumin: 4.5 g/dL (ref 3.5–5.0)
Alkaline Phosphatase: 64 U/L (ref 38–126)
Anion gap: 8 (ref 5–15)
BUN: 12 mg/dL (ref 8–23)
CO2: 27 mmol/L (ref 22–32)
Calcium: 10 mg/dL (ref 8.9–10.3)
Chloride: 106 mmol/L (ref 98–111)
Creatinine, Ser: 0.6 mg/dL (ref 0.44–1.00)
GFR, Estimated: >60 mL/min (ref >60)
Glucose, Bld: 91 mg/dL (ref 70–99)
Potassium: 3.6 mmol/L (ref 3.5–5.1)
Sodium: 141 mmol/L (ref 135–145)
Total Bilirubin: 0.6 mg/dL (ref 0.3–1.2)
Total Protein: 6.8 g/dL (ref 6.5–8.1)

## 2022-01-21 LAB — URINALYSIS, ROUTINE W REFLEX MICROSCOPIC
Bilirubin Urine: NEGATIVE
Glucose, UA: NEGATIVE mg/dL
Hgb urine dipstick: NEGATIVE
Ketones, ur: NEGATIVE mg/dL
Leukocytes,Ua: NEGATIVE
Nitrite: NEGATIVE
Protein, ur: NEGATIVE mg/dL
Specific Gravity, Urine: <1.005 — ABNORMAL LOW (ref 1.005–1.030)
pH: 5.5 (ref 5.0–8.0)

## 2022-01-21 LAB — CBC
HCT: 41.9 % (ref 36.0–46.0)
Hemoglobin: 14.1 g/dL (ref 12.0–15.0)
MCH: 30.6 pg (ref 26.0–34.0)
MCHC: 33.7 g/dL (ref 30.0–36.0)
MCV: 90.9 fL (ref 80.0–100.0)
Platelets: 272 10*3/uL (ref 150–400)
RBC: 4.61 MIL/uL (ref 3.87–5.11)
RDW: 13.1 % (ref 11.5–15.5)
WBC: 10.3 10*3/uL (ref 4.0–10.5)
nRBC: 0 % (ref 0.0–0.2)

## 2022-01-21 LAB — OCCULT BLOOD X 1 CARD TO LAB, STOOL: Fecal Occult Bld: POSITIVE — AB

## 2022-01-21 LAB — LIPASE, BLOOD: Lipase: 15 U/L (ref 11–51)

## 2022-01-21 MED ORDER — SODIUM CHLORIDE 0.9 % IV BOLUS
1000.0000 mL | Freq: Once | INTRAVENOUS | Status: AC
  Administered 2022-01-21: 1000 mL via INTRAVENOUS

## 2022-01-21 NOTE — ED Triage Notes (Signed)
Pt woke in the middle of the night with abd cramping and went to bathroom. Pt states she had a loose stool and was feeling weak. Pt was going to lay in the bathroom floor and leaned over and fell hitting head on floor. Pt denies LOC. EMS was called to scene and pt vitals were stable. Pt states this morning had bright red blood in stool. Pt has bruise and swelling to right side of forehead that only hurts when touched. Pt states abd pain has improved but still has mild discomfort.  ?

## 2022-01-21 NOTE — ED Provider Notes (Signed)
?[facility] ?Provider Note ? ? ?CSN: [id number] ?Arrival date & time: 01/21/22  1111 ? ?  ? ?History ? ?Chief Complaint  ?Patient presents with  ? Diarrhea  ? Fall  ? ? ?Catherine [name] is a 71 y.o. female. ? ?Presents ER chief complaint of lightheadedness, abdominal cramping and bloody diarrhea.  She states that yesterday she was on the toilet trained have a bowel movement and had several episodes of loose stools.  She got lightheaded and later self down onto the ground as she was laying down she later hit hit the ground bit hard and sustained a bump.  Denies loss of consciousness.  She was able to get up afterwards and had additional bowel movements throughout the night and this morning which she describes as bright red.  Otherwise currently denies any abdominal pain.  No vomiting reported.  No fever no cough. ? ? ?  ? ?Home Medications ?Prior to Admission medications   ?Medication Sig Start Date End Date Taking? Authorizing Provider  ?benzonatate (TESSALON) 100 MG capsule Take 1 capsule (100 mg total) by mouth every 8 (eight) hours. 05/16/18   Tasia Catchings, Amy V, PA-C  ?doxycycline (VIBRAMYCIN) 100 MG capsule Take 1 capsule (100 mg total) by mouth 2 (two) times daily. 05/16/18   Tasia Catchings, Amy V, PA-C  ?fexofenadine (ALLEGRA) 180 MG tablet Take 180 mg by mouth daily.    [provider]  ?ipratropium (ATROVENT) 0.06 % nasal spray Place 2 sprays into both nostrils 4 (four) times daily. 05/16/18   Ok Edwards, PA-C  ?   ? ?Allergies    ?Patient has no known allergies.   ? ?Review of Systems   ?Review of Systems  ?Constitutional:  Negative for fever.  ?HENT:  Negative for ear pain.   ?Eyes:  Negative for pain.  ?Respiratory:  Negative for cough.   ?Cardiovascular:  Negative for chest pain.  ?Gastrointestinal:  Positive for abdominal pain.  ?Genitourinary:  Negative for flank pain.  ?Musculoskeletal:  Negative for back pain.  ?Skin:  Negative for rash.  ?Neurological:  Negative for headaches.   ? ?Physical Exam ?Updated Vital Signs ?BP 126/74   Pulse (!) 51   Temp 98 ?F (36.7 ?C) (Oral)   Resp 13   Ht '5\' 6"'$  (1.676 m)   Wt 54.4 kg   SpO2 97%   BMI 19.37 kg/m?  ?Physical Exam ?Constitutional:   ?   General: She is not in acute distress. ?   Appearance: Normal appearance.  ?HENT:  ?   Head: Normocephalic.  ?   Comments: Contusion to the mid forehead.  No laceration noted. ?   Nose: Nose normal.  ?Eyes:  ?   Extraocular Movements: Extraocular movements intact.  ?Cardiovascular:  ?   Rate and Rhythm: Normal rate.  ?Pulmonary:  ?   Effort: Pulmonary effort is normal.  ?Abdominal:  ?   Tenderness: There is no abdominal tenderness. There is no guarding or rebound.  ?Musculoskeletal:     ?   General: Normal range of motion.  ?   Cervical back: Normal range of motion.  ?Neurological:  ?   General: No focal deficit present.  ?   Mental Status: She is alert. Mental status is at baseline.  ? ? ?ED Results / Procedures / Treatments   ?Labs ?(all labs ordered are listed, but only abnormal results are displayed) ?Labs Reviewed  ?URINALYSIS, ROUTINE W REFLEX MICROSCOPIC - Abnormal; Notable for the following components:  ?  Result Value  ? Color, Urine COLORLESS (*)   ? Specific Gravity, Urine <1.005 (*)   ? All other components within normal limits  ?OCCULT BLOOD X 1 CARD TO LAB, STOOL - Abnormal; Notable for the following components:  ? Fecal Occult Bld POSITIVE (*)   ? All other components within normal limits  ?LIPASE, BLOOD  ?COMPREHENSIVE METABOLIC PANEL  ?CBC  ? ? ?EKG ?None ? ?Radiology ?CT Head Wo Contrast ? ?Result Date: 01/21/2022 ?CLINICAL DATA:  Fall, hit head in bathroom, contusion to forehead EXAM: CT HEAD WITHOUT CONTRAST TECHNIQUE: Contiguous axial images were obtained from the base of the skull through the vertex without intravenous contrast. RADIATION DOSE REDUCTION: This exam was performed according to the departmental dose-optimization program which includes automated exposure control,  adjustment of the mA and/or kV according to patient size and/or use of iterative reconstruction technique. COMPARISON:  None. FINDINGS: Brain: No evidence of acute infarction, hemorrhage, hydrocephalus, extra-axial collection or mass lesion/mass effect. Mild periventricular white matter hypodensity. Vascular: No hyperdense vessel or unexpected calcification. Skull: Normal. Negative for fracture or focal lesion. Sinuses/Orbits: No acute finding. Other: None. IMPRESSION: No acute intracranial pathology. Small-vessel white matter disease. Electronically Signed   By: Delanna Ahmadi M.D.   On: 01/21/2022 13:23   ? ?Procedures ?Procedures  ? ? ?Medications Ordered in ED ?Medications  ?sodium chloride 0.9 % bolus 1,000 mL (1,000 mLs Intravenous New Bag/Given 01/21/22 1238)  ? ? ?ED Course/ Medical Decision Making/ A&P ?  ?                        ?Medical Decision Making ?Amount and/or Complexity of Data Reviewed ?Labs: ordered. ?Radiology: ordered. ? ? ?Review of records shows outpatient visit for immunology September 13, 2021. ? ?History obtained from the patient as well as her significant other at bedside. ? ?Patient maintained on cardiac monitor, shows sinus rhythm mildly bradycardic rate. ? ?Differential diagnoses includes syncope, intracranial hemorrhage, GI bleed, Crohn's disease versus other. ? ?Labs otherwise unremarkable white count normal chemistry normal hemoglobin normal at 14.  Urinalysis negative. ? ?CT imaging of the brain unremarkable no intracranial hemorrhage or fracture noted per radiologist. ? ?Rectal exam shows normal brown color stool.  Guaiac is however positive.  ? ?Given normal vitals and normal hemoglobin as well as benign exam, will recommend outpatient follow-up with GI.  Advised her to call her GI doctor within the week and advised immediate return if she has increased bleeding lightheadedness dizziness worsening symptoms or any additional concerns return to [facility] back to the  ER. ? ? ? ? ? ? ? ?Final Clinical Impression(s) / ED Diagnoses ?Final diagnoses:  ?Gastrointestinal hemorrhage, unspecified gastrointestinal hemorrhage type  ? ? ?Rx / DC Orders ?ED Discharge Orders   ? ? None  ? ?  ? ? ?  ?Luna Fuse, MD ?01/21/22 1334 ? ?

## 2022-01-21 NOTE — Discharge Instructions (Addendum)
Call your primary care doctor or specialist as discussed in the next 2-3 days.   Return immediately back to the ER if:  Your symptoms worsen within the next 12-24 hours. You develop new symptoms such as new fevers, persistent vomiting, new pain, shortness of breath, or new weakness or numbness, or if you have any other concerns.  

## 2022-01-30 ENCOUNTER — Ambulatory Visit
Admission: RE | Admit: 2022-01-30 | Discharge: 2022-01-30 | Disposition: A | Discharge status: Home / Self Care | Payer: Medicare Other | Source: Ambulatory Visit | Attending: Internal Medicine | Admitting: Internal Medicine

## 2022-01-30 DIAGNOSIS — Z1231 Encounter for screening mammogram for malignant neoplasm of breast: Secondary | ICD-10-CM | POA: Diagnosis not present

## 2022-02-28 DIAGNOSIS — M8589 Other specified disorders of bone density and structure, multiple sites: Secondary | ICD-10-CM | POA: Diagnosis not present

## 2022-02-28 DIAGNOSIS — E785 Hyperlipidemia, unspecified: Secondary | ICD-10-CM | POA: Diagnosis not present

## 2022-02-28 DIAGNOSIS — Z79899 Other long term (current) drug therapy: Secondary | ICD-10-CM | POA: Diagnosis not present

## 2022-02-28 DIAGNOSIS — M859 Disorder of bone density and structure, unspecified: Secondary | ICD-10-CM | POA: Diagnosis not present

## 2022-03-06 DIAGNOSIS — Z0000 Encounter for general adult medical examination without abnormal findings: Secondary | ICD-10-CM | POA: Diagnosis not present

## 2022-03-06 DIAGNOSIS — E785 Hyperlipidemia, unspecified: Secondary | ICD-10-CM | POA: Diagnosis not present

## 2022-03-06 DIAGNOSIS — J449 Chronic obstructive pulmonary disease, unspecified: Secondary | ICD-10-CM | POA: Diagnosis not present

## 2022-03-06 DIAGNOSIS — B029 Zoster without complications: Secondary | ICD-10-CM | POA: Diagnosis not present

## 2022-03-06 DIAGNOSIS — I77810 Thoracic aortic ectasia: Secondary | ICD-10-CM | POA: Diagnosis not present

## 2022-03-06 DIAGNOSIS — K5730 Diverticulosis of large intestine without perforation or abscess without bleeding: Secondary | ICD-10-CM | POA: Diagnosis not present

## 2022-03-06 DIAGNOSIS — I700 Atherosclerosis of aorta: Secondary | ICD-10-CM | POA: Diagnosis not present

## 2022-03-06 DIAGNOSIS — F17210 Nicotine dependence, cigarettes, uncomplicated: Secondary | ICD-10-CM | POA: Diagnosis not present

## 2022-03-06 DIAGNOSIS — Z1339 Encounter for screening examination for other mental health and behavioral disorders: Secondary | ICD-10-CM | POA: Diagnosis not present

## 2022-03-06 DIAGNOSIS — R911 Solitary pulmonary nodule: Secondary | ICD-10-CM | POA: Diagnosis not present

## 2022-03-06 DIAGNOSIS — R82998 Other abnormal findings in urine: Secondary | ICD-10-CM | POA: Diagnosis not present

## 2022-03-06 DIAGNOSIS — Z1331 Encounter for screening for depression: Secondary | ICD-10-CM | POA: Diagnosis not present

## 2022-03-06 DIAGNOSIS — Z23 Encounter for immunization: Secondary | ICD-10-CM | POA: Diagnosis not present

## 2022-03-25 ENCOUNTER — Other Ambulatory Visit: Payer: Self-pay | Admitting: Internal Medicine

## 2022-03-25 DIAGNOSIS — F17209 Nicotine dependence, unspecified, with unspecified nicotine-induced disorders: Secondary | ICD-10-CM

## 2022-04-25 ENCOUNTER — Encounter: Payer: Self-pay | Admitting: Internal Medicine

## 2022-05-03 ENCOUNTER — Encounter: Payer: Self-pay | Admitting: Internal Medicine

## 2022-06-17 ENCOUNTER — Ambulatory Visit (AMBULATORY_SURGERY_CENTER): Payer: Self-pay | Admitting: *Deleted

## 2022-06-17 ENCOUNTER — Telehealth: Payer: Self-pay | Admitting: *Deleted

## 2022-06-17 VITALS — Ht 66.0 in | Wt 124.6 lb

## 2022-06-17 DIAGNOSIS — Z1211 Encounter for screening for malignant neoplasm of colon: Secondary | ICD-10-CM

## 2022-06-17 MED ORDER — NA SULFATE-K SULFATE-MG SULF 17.5-3.13-1.6 GM/177ML PO SOLN: 1.0000 | Freq: Once | ORAL | 0 refills | Status: AC

## 2022-06-17 NOTE — Telephone Encounter (Signed)
Cyril Mourning, Reviewed. Ok to proceed with direct colon in the [facility]. Thanks, JP

## 2022-06-17 NOTE — Progress Notes (Signed)
  PONV at age 71 only, denies being told they were difficult to intubate, or hx/fam hx of malignant hyperthermia per pt   No egg or soy allergy  No home oxygen use   No medications for weight loss taken  Pt denies constipation issues  Pt informed that we do not do prior authorizations for prep  Pt had an episode of GI bleeding and diarrhea in March- she was seen in the ER for this.  She has not had bleeding issues since then but has had diarrhea a couple of times since.  Dr. Henrene Pastor made aware of this.

## 2022-06-17 NOTE — Telephone Encounter (Signed)
Dr. Henrene Pastor,  I saw this pt in previsit this am.  Her screening colonoscopy is scheduled for 07-08-22. Just wanted to make you aware.   She had an episode of GI bleeding and diarrhea in March- she was seen in the ER for this.  She has not had bleeding issues since then but has had diarrhea a couple of times since.  Just an FYI.  Is she ok to proceed or would you like an OV first?  Thanks, Cyril Mourning

## 2022-06-24 NOTE — Telephone Encounter (Signed)
noted 

## 2022-07-08 ENCOUNTER — Encounter: Payer: Self-pay | Admitting: Internal Medicine

## 2022-07-08 ENCOUNTER — Ambulatory Visit (AMBULATORY_SURGERY_CENTER): Payer: Medicare Other | Admitting: Internal Medicine

## 2022-07-08 VITALS — BP 121/72 | HR 65 | Temp 97.3°F | Resp 11 | Ht 66.0 in | Wt 124.2 lb

## 2022-07-08 DIAGNOSIS — Z1211 Encounter for screening for malignant neoplasm of colon: Secondary | ICD-10-CM | POA: Diagnosis not present

## 2022-07-08 HISTORY — PX: COLONOSCOPY: SHX174

## 2022-07-08 MED ORDER — SODIUM CHLORIDE 0.9 % IV SOLN: 500.0000 mL | Freq: Once | INTRAVENOUS | Status: DC

## 2022-07-08 NOTE — Progress Notes (Signed)
Pt's states no medical or surgical changes since previsit or office visit. 

## 2022-07-08 NOTE — Patient Instructions (Signed)
YOU HAD AN ENDOSCOPIC PROCEDURE TODAY AT [facility]:   Refer to the procedure report that was given to you for any specific questions about what was found during the examination.  If the procedure report does not answer your questions, please call your gastroenterologist to clarify.  If you requested that your care partner not be given the details of your procedure findings, then the procedure report has been included in a sealed envelope for you to review at your convenience later.  YOU SHOULD EXPECT: Some feelings of bloating in the abdomen. Passage of more gas than usual.  Walking can help get rid of the air that was put into your GI tract during the procedure and reduce the bloating. If you had a lower endoscopy (such as a colonoscopy or flexible sigmoidoscopy) you may notice spotting of blood in your stool or on the toilet paper. If you underwent a bowel prep for your procedure, you may not have a normal bowel movement for a few days.  Please Note:  You might notice some irritation and congestion in your nose or some drainage.  This is from the oxygen used during your procedure.  There is no need for concern and it should clear up in a day or so.  SYMPTOMS TO REPORT IMMEDIATELY:  Following lower endoscopy (colonoscopy or flexible sigmoidoscopy):  Excessive amounts of blood in the stool  Significant tenderness or worsening of abdominal pains  Swelling of the abdomen that is new, acute  Fever of 100F or higher  For urgent or emergent issues, a gastroenterologist can be reached at any hour by calling [phone]. Do not use MyChart messaging for urgent concerns.    DIET:  We do recommend a small meal at first, but then you may proceed to your regular diet.  Drink plenty of fluids but you should avoid alcoholic beverages for 24 hours.  ACTIVITY:  You should plan to take it easy for the rest of today and you should NOT DRIVE or use heavy machinery until tomorrow (because of  the sedation medicines used during the test).    FOLLOW UP: Our staff will call the number listed on your records the next business day following your procedure.  We will call around 7:15- 8:00 am to check on you and address any questions or concerns that you may have regarding the information given to you following your procedure. If we do not reach you, we will leave a message.  If you develop any symptoms (ie: fever, flu-like symptoms, shortness of breath, cough etc.) before then, please call [phone].  If you test positive for Covid 19 in the 2 weeks post procedure, please call and report this information to [place].    SIGNATURES/CONFIDENTIALITY: You and/or your care partner have signed paperwork which will be entered into your electronic medical record.  These signatures attest to the fact that that the information above on your After Visit Summary has been reviewed and is understood.  Full responsibility of the confidentiality of this discharge information lies with you and/or your care-partner.

## 2022-07-08 NOTE — Op Note (Signed)
[place] Patient Name: Catherine [name] Procedure Date: 07/08/2022 11:17 AM MRN: [id number] Endoscopist: Docia Chuck. Henrene Pastor , MD Age: 71 Referring MD:  Date of Birth: 19-May-1951 Gender: Female Account #: [id number] Procedure:                Colonoscopy Indications:              Screening for colorectal malignant neoplasm.                            Previous examination 2013 negative for neoplasia Medicines:                Monitored Anesthesia Care Procedure:                Pre-Anesthesia Assessment:                           - Prior to the procedure, a History and Physical                            was performed, and patient medications and                            allergies were reviewed. The patient's tolerance of                            previous anesthesia was also reviewed. The risks                            and benefits of the procedure and the sedation                            options and risks were discussed with the patient.                            All questions were answered, and informed consent                            was obtained. Prior Anticoagulants: The patient has                            taken no previous anticoagulant or antiplatelet                            agents. ASA Grade Assessment: II - A patient with                            mild systemic disease. After reviewing the risks                            and benefits, the patient was deemed in                            satisfactory condition to undergo the procedure.  After obtaining informed consent, the colonoscope                            was passed under direct vision. Throughout the                            procedure, the patient's blood pressure, pulse, and                            oxygen saturations were monitored continuously. The                            CF [id number] #[id number] was introduced through the anus                            and advanced  to the the cecum, identified by                            appendiceal orifice and ileocecal valve. The                            Olympus [id number]-[id number] (#[id number]) Colonoscope was                            introduced through the anus and advanced to the the                            cecum, identified by appendiceal orifice and                            ileocecal valve. The ileocecal valve, appendiceal                            orifice, and rectum were photographed. The quality                            of the bowel preparation was excellent. The                            colonoscopy was performed without difficulty. The                            patient tolerated the procedure well. The bowel                            preparation used was SUPREP via split dose                            instruction. Scope In: 11:28:08 AM Scope Out: 11:41:04 AM Scope Withdrawal Time: 0 hours 7 minutes 44 seconds  Total Procedure Duration: 0 hours 12 minutes 56 seconds  Findings:                 Multiple diverticula were found in the sigmoid  colon and ascending colon.                           The exam was otherwise without abnormality on                            direct and retroflexion views. Complications:            No immediate complications. Estimated blood loss:                            None. Estimated Blood Loss:     Estimated blood loss: none. Impression:               - Diverticulosis in the sigmoid colon and in the                            ascending colon.                           - The examination was otherwise normal on direct                            and retroflexion views.                           - No specimens collected. Recommendation:           - Repeat colonoscopy in 10 years for screening                            purposes.                           - Patient has a contact number available for                            emergencies. The signs  and symptoms of potential                            delayed complications were discussed with the                            patient. Return to normal activities tomorrow.                            Written discharge instructions were provided to the                            patient.                           - Resume previous diet.                           - Continue present medications. Docia Chuck. Henrene Pastor, MD 07/08/2022 11:47:19 AM This report has been signed electronically.

## 2022-07-08 NOTE — Progress Notes (Signed)
HISTORY OF PRESENT ILLNESS:  Catherine [name] is a 71 y.o. female who presents for screening colonoscopy.  Negative prior exam 2013.  No complaints  REVIEW OF SYSTEMS:  All non-GI ROS negative. Past Medical History:  Diagnosis Date   Allergy    takes Allegra daily and Nasacort daily   Cancer ([place])    breast CA (right), skin CA   Diverticulosis    Eczema    uses topical Hydrocortisone daily as needed   History of shingles    Internal hemorrhoids    Joint pain    Nocturia    Osteopenia    PONV (postoperative nausea and vomiting)    Rosacea    uses topical Metrogel daily as needed   Seasonal allergies    Urinary frequency     Past Surgical History:  Procedure Laterality Date   BASAL CELL CARCINOMA EXCISION     From the right cheek   BREAST BIOPSY Right 12/09/2013   right upper inner   BREAST LUMPECTOMY Right 2015   BREAST LUMPECTOMY WITH NEEDLE LOCALIZATION Right 01/04/2014   Procedure: BREAST LUMPECTOMY WITH NEEDLE LOCALIZATION;  Surgeon: Edward Jolly, MD;  Location: [facility];  Service: General;  Laterality: Right;   COLONOSCOPY     COLONOSCOPY  07/08/2022   TONSILLECTOMY     at age 19   uterine polyp removed  5+yrs ago    Social History Catherine [name]  reports that she has been smoking cigarettes. She has a 10.00 pack-year smoking history. She has never used smokeless tobacco. She reports current alcohol use. She reports that she does not use drugs.  family history includes ALS in her brother; Alzheimer's disease in her mother; Bladder Cancer in her brother and brother; Breast cancer (age of onset: 21) in her paternal aunt; Heart attack in her father.  Allergies  Allergen Reactions   Latex Hives and Rash       PHYSICAL EXAMINATION: Vital signs: BP 135/70   Pulse 60   Temp (!) 97.3 F (36.3 C) (Temporal)   Resp 13   Ht '5\' 6"'$  (1.676 m)   Wt 124 lb 3.2 oz (56.3 kg)   SpO2 100%   BMI 20.05 kg/m  General: Well-developed, well-nourished,  no acute distress HEENT: Sclerae are anicteric, conjunctiva pink. Oral mucosa intact Lungs: Clear Heart: Regular Abdomen: soft, nontender, nondistended, no obvious ascites, no peritoneal signs, normal bowel sounds. No organomegaly. Extremities: No edema Psychiatric: alert and oriented x3. Cooperative      ASSESSMENT:  Colon cancer screening   PLAN:   Screening colonoscopy

## 2022-07-08 NOTE — Progress Notes (Signed)
A and O x3. Report to RN. Tolerated MAC anesthesia well. 

## 2022-07-09 ENCOUNTER — Telehealth: Payer: Self-pay | Admitting: *Deleted

## 2022-07-09 NOTE — Telephone Encounter (Signed)
  Follow up Call-     07/08/2022   10:52 AM  Call back number  Post procedure Call Back phone  # [phone]  Permission to leave phone message Yes     Patient questions:  Do you have a fever, pain , or abdominal swelling? No. Pain Score  0 *  Have you tolerated food without any problems? Yes.    Have you been able to return to your normal activities? Yes.    Do you have any questions about your discharge instructions: Diet   No. Medications  No. Follow up visit  No.  Do you have questions or concerns about your Care? No.  Actions: * If pain score is 4 or above: No action needed, pain <4.   Follow up Call-     07/08/2022   10:52 AM  Call back number  Post procedure Call Back phone  # [phone]  Permission to leave phone message Yes     Patient questions:  Do you have a fever, pain , or abdominal swelling? No. Pain Score  0 *  Have you tolerated food without any problems? Yes.    Have you been able to return to your normal activities? Yes.    Do you have any questions about your discharge instructions: Diet   No. Medications  No. Follow up visit  No.  Do you have questions or concerns about your Care? No.  Actions: * If pain score is 4 or above: No action needed, pain <4.

## 2022-08-27 ENCOUNTER — Ambulatory Visit
Admission: RE | Admit: 2022-08-27 | Discharge: 2022-08-27 | Disposition: A | Discharge status: Home / Self Care | Payer: Medicare Other | Source: Ambulatory Visit | Attending: Internal Medicine | Admitting: Internal Medicine

## 2022-08-27 DIAGNOSIS — F17210 Nicotine dependence, cigarettes, uncomplicated: Secondary | ICD-10-CM | POA: Diagnosis not present

## 2022-08-27 DIAGNOSIS — F17209 Nicotine dependence, unspecified, with unspecified nicotine-induced disorders: Secondary | ICD-10-CM

## 2022-08-29 DIAGNOSIS — Z23 Encounter for immunization: Secondary | ICD-10-CM | POA: Diagnosis not present

## 2022-09-04 DIAGNOSIS — M791 Myalgia, unspecified site: Secondary | ICD-10-CM | POA: Diagnosis not present

## 2022-12-09 DIAGNOSIS — N390 Urinary tract infection, site not specified: Secondary | ICD-10-CM | POA: Diagnosis not present

## 2022-12-24 DIAGNOSIS — D1801 Hemangioma of skin and subcutaneous tissue: Secondary | ICD-10-CM | POA: Diagnosis not present

## 2022-12-24 DIAGNOSIS — L814 Other melanin hyperpigmentation: Secondary | ICD-10-CM | POA: Diagnosis not present

## 2022-12-24 DIAGNOSIS — L308 Other specified dermatitis: Secondary | ICD-10-CM | POA: Diagnosis not present

## 2022-12-24 DIAGNOSIS — D2372 Other benign neoplasm of skin of left lower limb, including hip: Secondary | ICD-10-CM | POA: Diagnosis not present

## 2022-12-24 DIAGNOSIS — L720 Epidermal cyst: Secondary | ICD-10-CM | POA: Diagnosis not present

## 2022-12-24 DIAGNOSIS — Z85828 Personal history of other malignant neoplasm of skin: Secondary | ICD-10-CM | POA: Diagnosis not present

## 2022-12-24 DIAGNOSIS — L718 Other rosacea: Secondary | ICD-10-CM | POA: Diagnosis not present

## 2022-12-24 DIAGNOSIS — L821 Other seborrheic keratosis: Secondary | ICD-10-CM | POA: Diagnosis not present

## 2023-01-03 DIAGNOSIS — R82998 Other abnormal findings in urine: Secondary | ICD-10-CM | POA: Diagnosis not present

## 2023-01-10 ENCOUNTER — Other Ambulatory Visit: Payer: Self-pay | Admitting: Internal Medicine

## 2023-01-10 DIAGNOSIS — Z1231 Encounter for screening mammogram for malignant neoplasm of breast: Secondary | ICD-10-CM

## 2023-02-26 ENCOUNTER — Ambulatory Visit: Payer: Medicare Other

## 2023-02-28 ENCOUNTER — Ambulatory Visit
Admission: RE | Admit: 2023-02-28 | Discharge: 2023-02-28 | Disposition: A | Discharge status: Home / Self Care | Payer: Medicare Other | Source: Ambulatory Visit | Attending: Internal Medicine | Admitting: Internal Medicine

## 2023-02-28 DIAGNOSIS — Z1231 Encounter for screening mammogram for malignant neoplasm of breast: Secondary | ICD-10-CM

## 2023-04-08 DIAGNOSIS — I700 Atherosclerosis of aorta: Secondary | ICD-10-CM | POA: Diagnosis not present

## 2023-04-08 DIAGNOSIS — M8580 Other specified disorders of bone density and structure, unspecified site: Secondary | ICD-10-CM | POA: Diagnosis not present

## 2023-04-08 DIAGNOSIS — E785 Hyperlipidemia, unspecified: Secondary | ICD-10-CM | POA: Diagnosis not present

## 2023-04-14 DIAGNOSIS — Z1389 Encounter for screening for other disorder: Secondary | ICD-10-CM | POA: Diagnosis not present

## 2023-04-14 DIAGNOSIS — J449 Chronic obstructive pulmonary disease, unspecified: Secondary | ICD-10-CM | POA: Diagnosis not present

## 2023-04-14 DIAGNOSIS — F17210 Nicotine dependence, cigarettes, uncomplicated: Secondary | ICD-10-CM | POA: Diagnosis not present

## 2023-04-14 DIAGNOSIS — M8580 Other specified disorders of bone density and structure, unspecified site: Secondary | ICD-10-CM | POA: Diagnosis not present

## 2023-04-14 DIAGNOSIS — R911 Solitary pulmonary nodule: Secondary | ICD-10-CM | POA: Diagnosis not present

## 2023-04-14 DIAGNOSIS — R3121 Asymptomatic microscopic hematuria: Secondary | ICD-10-CM | POA: Diagnosis not present

## 2023-04-14 DIAGNOSIS — E785 Hyperlipidemia, unspecified: Secondary | ICD-10-CM | POA: Diagnosis not present

## 2023-04-14 DIAGNOSIS — I77810 Thoracic aortic ectasia: Secondary | ICD-10-CM | POA: Diagnosis not present

## 2023-04-14 DIAGNOSIS — Z1331 Encounter for screening for depression: Secondary | ICD-10-CM | POA: Diagnosis not present

## 2023-04-14 DIAGNOSIS — R82998 Other abnormal findings in urine: Secondary | ICD-10-CM | POA: Diagnosis not present

## 2023-04-14 DIAGNOSIS — Z0000 Encounter for general adult medical examination without abnormal findings: Secondary | ICD-10-CM | POA: Diagnosis not present

## 2023-04-14 DIAGNOSIS — I700 Atherosclerosis of aorta: Secondary | ICD-10-CM | POA: Diagnosis not present

## 2023-04-25 ENCOUNTER — Other Ambulatory Visit: Payer: Self-pay | Admitting: Internal Medicine

## 2023-04-25 DIAGNOSIS — F17210 Nicotine dependence, cigarettes, uncomplicated: Secondary | ICD-10-CM

## 2023-06-23 DIAGNOSIS — M25471 Effusion, right ankle: Secondary | ICD-10-CM | POA: Diagnosis not present

## 2023-06-23 DIAGNOSIS — W57XXXA Bitten or stung by nonvenomous insect and other nonvenomous arthropods, initial encounter: Secondary | ICD-10-CM | POA: Diagnosis not present

## 2023-09-01 ENCOUNTER — Other Ambulatory Visit: Payer: Medicare Other

## 2023-09-09 ENCOUNTER — Ambulatory Visit
Admission: RE | Admit: 2023-09-09 | Discharge: 2023-09-09 | Disposition: A | Discharge status: Home / Self Care | Payer: Medicare Other | Source: Ambulatory Visit | Attending: Internal Medicine | Admitting: Internal Medicine

## 2023-09-09 DIAGNOSIS — F17210 Nicotine dependence, cigarettes, uncomplicated: Secondary | ICD-10-CM | POA: Diagnosis not present

## 2023-10-21 DIAGNOSIS — J449 Chronic obstructive pulmonary disease, unspecified: Secondary | ICD-10-CM | POA: Diagnosis not present

## 2023-10-21 DIAGNOSIS — R059 Cough, unspecified: Secondary | ICD-10-CM | POA: Diagnosis not present

## 2023-10-21 DIAGNOSIS — U071 COVID-19: Secondary | ICD-10-CM | POA: Diagnosis not present

## 2023-10-21 DIAGNOSIS — J029 Acute pharyngitis, unspecified: Secondary | ICD-10-CM | POA: Diagnosis not present

## 2023-10-21 DIAGNOSIS — R0981 Nasal congestion: Secondary | ICD-10-CM | POA: Diagnosis not present

## 2023-10-21 DIAGNOSIS — F17210 Nicotine dependence, cigarettes, uncomplicated: Secondary | ICD-10-CM | POA: Diagnosis not present

## 2023-10-21 DIAGNOSIS — Z1152 Encounter for screening for COVID-19: Secondary | ICD-10-CM | POA: Diagnosis not present

## 2023-10-21 DIAGNOSIS — R6883 Chills (without fever): Secondary | ICD-10-CM | POA: Diagnosis not present

## 2024-01-16 ENCOUNTER — Other Ambulatory Visit: Payer: Self-pay | Admitting: Internal Medicine

## 2024-01-16 DIAGNOSIS — Z1231 Encounter for screening mammogram for malignant neoplasm of breast: Secondary | ICD-10-CM

## 2024-01-27 DIAGNOSIS — L570 Actinic keratosis: Secondary | ICD-10-CM | POA: Diagnosis not present

## 2024-01-27 DIAGNOSIS — D1801 Hemangioma of skin and subcutaneous tissue: Secondary | ICD-10-CM | POA: Diagnosis not present

## 2024-01-27 DIAGNOSIS — L718 Other rosacea: Secondary | ICD-10-CM | POA: Diagnosis not present

## 2024-01-27 DIAGNOSIS — L821 Other seborrheic keratosis: Secondary | ICD-10-CM | POA: Diagnosis not present

## 2024-01-27 DIAGNOSIS — L578 Other skin changes due to chronic exposure to nonionizing radiation: Secondary | ICD-10-CM | POA: Diagnosis not present

## 2024-01-27 DIAGNOSIS — L820 Inflamed seborrheic keratosis: Secondary | ICD-10-CM | POA: Diagnosis not present

## 2024-01-27 DIAGNOSIS — Z85828 Personal history of other malignant neoplasm of skin: Secondary | ICD-10-CM | POA: Diagnosis not present

## 2024-01-27 DIAGNOSIS — D2262 Melanocytic nevi of left upper limb, including shoulder: Secondary | ICD-10-CM | POA: Diagnosis not present

## 2024-01-27 DIAGNOSIS — D225 Melanocytic nevi of trunk: Secondary | ICD-10-CM | POA: Diagnosis not present

## 2024-01-27 DIAGNOSIS — D692 Other nonthrombocytopenic purpura: Secondary | ICD-10-CM | POA: Diagnosis not present

## 2024-01-27 DIAGNOSIS — L720 Epidermal cyst: Secondary | ICD-10-CM | POA: Diagnosis not present

## 2024-03-01 ENCOUNTER — Ambulatory Visit
Admission: RE | Admit: 2024-03-01 | Discharge: 2024-03-01 | Disposition: A | Discharge status: Home / Self Care | Source: Ambulatory Visit | Attending: Internal Medicine | Admitting: Internal Medicine

## 2024-03-01 DIAGNOSIS — Z1231 Encounter for screening mammogram for malignant neoplasm of breast: Secondary | ICD-10-CM | POA: Diagnosis not present

## 2024-03-12 DIAGNOSIS — H18593 Other hereditary corneal dystrophies, bilateral: Secondary | ICD-10-CM | POA: Diagnosis not present

## 2024-03-12 DIAGNOSIS — H2513 Age-related nuclear cataract, bilateral: Secondary | ICD-10-CM | POA: Diagnosis not present

## 2024-03-12 DIAGNOSIS — H25013 Cortical age-related cataract, bilateral: Secondary | ICD-10-CM | POA: Diagnosis not present

## 2024-03-12 DIAGNOSIS — H40051 Ocular hypertension, right eye: Secondary | ICD-10-CM | POA: Diagnosis not present

## 2024-07-20 DIAGNOSIS — Z0189 Encounter for other specified special examinations: Secondary | ICD-10-CM | POA: Diagnosis not present

## 2024-07-20 DIAGNOSIS — E785 Hyperlipidemia, unspecified: Secondary | ICD-10-CM | POA: Diagnosis not present

## 2024-07-20 DIAGNOSIS — R7989 Other specified abnormal findings of blood chemistry: Secondary | ICD-10-CM | POA: Diagnosis not present

## 2024-07-20 DIAGNOSIS — M8580 Other specified disorders of bone density and structure, unspecified site: Secondary | ICD-10-CM | POA: Diagnosis not present

## 2024-07-20 DIAGNOSIS — M8589 Other specified disorders of bone density and structure, multiple sites: Secondary | ICD-10-CM | POA: Diagnosis not present

## 2024-07-20 DIAGNOSIS — Z1212 Encounter for screening for malignant neoplasm of rectum: Secondary | ICD-10-CM | POA: Diagnosis not present

## 2024-07-23 DIAGNOSIS — M545 Low back pain, unspecified: Secondary | ICD-10-CM | POA: Diagnosis not present

## 2024-07-28 DIAGNOSIS — K5730 Diverticulosis of large intestine without perforation or abscess without bleeding: Secondary | ICD-10-CM | POA: Diagnosis not present

## 2024-07-28 DIAGNOSIS — M810 Age-related osteoporosis without current pathological fracture: Secondary | ICD-10-CM | POA: Diagnosis not present

## 2024-07-28 DIAGNOSIS — F17210 Nicotine dependence, cigarettes, uncomplicated: Secondary | ICD-10-CM | POA: Diagnosis not present

## 2024-07-28 DIAGNOSIS — J309 Allergic rhinitis, unspecified: Secondary | ICD-10-CM | POA: Diagnosis not present

## 2024-07-28 DIAGNOSIS — R82998 Other abnormal findings in urine: Secondary | ICD-10-CM | POA: Diagnosis not present

## 2024-07-28 DIAGNOSIS — I77810 Thoracic aortic ectasia: Secondary | ICD-10-CM | POA: Diagnosis not present

## 2024-07-28 DIAGNOSIS — B029 Zoster without complications: Secondary | ICD-10-CM | POA: Diagnosis not present

## 2024-07-28 DIAGNOSIS — I700 Atherosclerosis of aorta: Secondary | ICD-10-CM | POA: Diagnosis not present

## 2024-07-28 DIAGNOSIS — Z0000 Encounter for general adult medical examination without abnormal findings: Secondary | ICD-10-CM | POA: Diagnosis not present

## 2024-07-28 DIAGNOSIS — Z1331 Encounter for screening for depression: Secondary | ICD-10-CM | POA: Diagnosis not present

## 2024-07-28 DIAGNOSIS — R911 Solitary pulmonary nodule: Secondary | ICD-10-CM | POA: Diagnosis not present

## 2024-07-28 DIAGNOSIS — L719 Rosacea, unspecified: Secondary | ICD-10-CM | POA: Diagnosis not present

## 2024-07-28 DIAGNOSIS — J439 Emphysema, unspecified: Secondary | ICD-10-CM | POA: Diagnosis not present

## 2024-09-16 ENCOUNTER — Other Ambulatory Visit: Payer: Self-pay | Admitting: Internal Medicine

## 2024-09-16 DIAGNOSIS — F17210 Nicotine dependence, cigarettes, uncomplicated: Secondary | ICD-10-CM

## 2024-09-20 ENCOUNTER — Ambulatory Visit
Admission: RE | Admit: 2024-09-20 | Discharge: 2024-09-20 | Disposition: A | Discharge status: Home / Self Care | Source: Ambulatory Visit | Attending: Internal Medicine | Admitting: Internal Medicine

## 2024-09-20 DIAGNOSIS — F17210 Nicotine dependence, cigarettes, uncomplicated: Secondary | ICD-10-CM | POA: Diagnosis not present

## 2024-10-05 DIAGNOSIS — M5459 Other low back pain: Secondary | ICD-10-CM | POA: Diagnosis not present

## 2024-10-05 DIAGNOSIS — M545 Low back pain, unspecified: Secondary | ICD-10-CM | POA: Diagnosis not present

## 2024-10-21 DIAGNOSIS — H2513 Age-related nuclear cataract, bilateral: Secondary | ICD-10-CM | POA: Diagnosis not present

## 2024-10-21 DIAGNOSIS — H40053 Ocular hypertension, bilateral: Secondary | ICD-10-CM | POA: Diagnosis not present

## 2024-10-25 DIAGNOSIS — M545 Low back pain, unspecified: Secondary | ICD-10-CM | POA: Diagnosis not present
